# Patient Record
Sex: Female | Born: 1985 | Race: Black or African American | Hispanic: No | Marital: Single | Smoking: Never smoker
Health system: Southern US, Community
[De-identification: ages and names within clinical notes are randomized; demographics above are authoritative.]

## PROBLEM LIST (undated history)

## (undated) DIAGNOSIS — R51 Headache: Secondary | ICD-10-CM

## (undated) DIAGNOSIS — R519 Headache, unspecified: Secondary | ICD-10-CM

## (undated) DIAGNOSIS — D509 Iron deficiency anemia, unspecified: Secondary | ICD-10-CM

## (undated) DIAGNOSIS — N200 Calculus of kidney: Secondary | ICD-10-CM

## (undated) DIAGNOSIS — Z87442 Personal history of urinary calculi: Secondary | ICD-10-CM

## (undated) HISTORY — PX: CYSTECTOMY: SUR359

## (undated) HISTORY — PX: BREAST SURGERY: SHX581

---

## 2013-11-20 ENCOUNTER — Encounter (HOSPITAL_COMMUNITY): Payer: Self-pay | Admitting: Emergency Medicine

## 2013-11-20 ENCOUNTER — Emergency Department (INDEPENDENT_AMBULATORY_CARE_PROVIDER_SITE_OTHER)
Admission: EM | Admit: 2013-11-20 | Discharge: 2013-11-20 | Disposition: A | Discharge status: Home / Self Care | Payer: 59 | Source: Home / Self Care | Attending: Family Medicine | Admitting: Family Medicine

## 2013-11-20 DIAGNOSIS — M545 Low back pain, unspecified: Secondary | ICD-10-CM

## 2013-11-20 DIAGNOSIS — Z041 Encounter for examination and observation following transport accident: Secondary | ICD-10-CM

## 2013-11-20 MED ORDER — DICLOFENAC POTASSIUM 50 MG PO TABS: 50.0000 mg | ORAL_TABLET | Freq: Three times a day (TID) | ORAL | Status: DC

## 2013-11-20 MED ORDER — CYCLOBENZAPRINE HCL 5 MG PO TABS: 5.0000 mg | ORAL_TABLET | Freq: Three times a day (TID) | ORAL | Status: DC | PRN
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
## 2015-10-10 ENCOUNTER — Ambulatory Visit: Payer: Self-pay | Admitting: Gynecology

## 2015-10-31 ENCOUNTER — Encounter: Payer: Self-pay | Admitting: Gynecology

## 2015-10-31 ENCOUNTER — Ambulatory Visit (INDEPENDENT_AMBULATORY_CARE_PROVIDER_SITE_OTHER): Payer: 59 | Admitting: Gynecology

## 2015-10-31 VITALS — BP 110/78 | Ht 65.0 in | Wt 203.0 lb

## 2015-10-31 DIAGNOSIS — Z01419 Encounter for gynecological examination (general) (routine) without abnormal findings: Secondary | ICD-10-CM

## 2015-10-31 DIAGNOSIS — D251 Intramural leiomyoma of uterus: Secondary | ICD-10-CM

## 2015-10-31 NOTE — Patient Instructions (Signed)
Follow up for ultrasound as scheduled 

## 2015-10-31 NOTE — Progress Notes (Signed)
    Kristina Leon Jun 25, 1986 [id number]        30 y.o.  G0P0000 new patient for annual exam.  Recently was evaluated in January for annual exam and was found to have a palpable lower abdominal mass. She subsequently had an ultrasound which showed multiple leiomyoma with a large fundal 12 cm myoma. Patient notes her menses are regular monthly and moderate to heavy. No intermenstrual bleeding.  Patient is virginal and never had a Pap smear.  Past medical history,surgical history, problem list, medications, allergies, family history and social history were all reviewed and documented as reviewed in the EPIC chart.  ROS:  Performed with pertinent positives and negatives included in the history, assessment and plan.   Additional significant findings :  none   Exam: Leanne Lovely Vitals:   10/31/15 1201  BP: 110/78  Height: 5\' 5"  (1.651 m)  Weight: 203 lb (92.08 kg)   General appearance:  Normal affect, orientation and appearance. Skin: Grossly normal HEENT: Without gross lesions.  No cervical or supraclavicular adenopathy. Thyroid normal.  Lungs:  Clear without wheezing, rales or rhonchi Cardiac: RR, without RMG Abdominal:  Soft, nontender with palpable firm abdominopelvic mass to the umbilicus consistent with her history of leiomyoma. Breasts:  Examined lying and sitting without masses, retractions, discharge or axillary adenopathy.  Small scar in the right tail of Spence Pelvic:  Ext/BUS/vagina virginal with limited speculum exposure.  Cervix with limited speculum exposure. Pap smear done  Uterus grossly enlarged to the level of the umbilicus.   Adnexa without masses or tenderness    Anus and perineum normal   Rectovaginal normal sphincter tone without palpated masses or tenderness.    Assessment/Plan:  30 y.o. G0P0000 female for annual exam with regular menses, virginal status.   1. Leiomyoma. Patient with ultrasound report showing large fundal 12 cm myoma and multiple  other intramural myomas. She does note some abdominal discomfort and pressure symptoms. Menses moderate and regular. Virginal status. Reviewed situation with the patient. I discussed the issues of leiomyoma and options for management include continued expectant management, medications for menstrual suppression, surgery such as myomectomy up to including hysterectomy. Approaches to include open versus robotic also discussed. The pros/cons, risks/benefits of each choice discussed. Will start with follow up ultrasound now to better define the myomas and rule out adnexal pathology. Patient will think of her options and and follow up with me at that time. If we proceed with myomectomy she understands that I would perform an open myomectomy. Risks include transfusion as well as unexpected complications necessitating hysterectomy was discussed. Referral for robotic assessment discussed and offered. Will rediscuss after ultrasound. 2. First Pap smear done today. 3. Breast health. Patient does have a history of right breast mass excision the patient thinks was fibroma. Normal exam today. Patient will continue with self breast exams monthly. 4. Health maintenance. No routine lab work done as this was done at her primary exam in January   Anastasio Auerbach MD, 12:57 PM 10/31/2015

## 2015-11-10 ENCOUNTER — Other Ambulatory Visit: Payer: Self-pay | Admitting: Gynecology

## 2015-11-10 ENCOUNTER — Encounter: Payer: Self-pay | Admitting: Gynecology

## 2015-11-10 ENCOUNTER — Ambulatory Visit (INDEPENDENT_AMBULATORY_CARE_PROVIDER_SITE_OTHER): Payer: 59 | Admitting: Gynecology

## 2015-11-10 ENCOUNTER — Ambulatory Visit (INDEPENDENT_AMBULATORY_CARE_PROVIDER_SITE_OTHER): Payer: 59

## 2015-11-10 VITALS — BP 112/74

## 2015-11-10 DIAGNOSIS — R9389 Abnormal findings on diagnostic imaging of other specified body structures: Secondary | ICD-10-CM

## 2015-11-10 DIAGNOSIS — D251 Intramural leiomyoma of uterus: Secondary | ICD-10-CM

## 2015-11-10 DIAGNOSIS — N852 Hypertrophy of uterus: Secondary | ICD-10-CM

## 2015-11-10 DIAGNOSIS — N831 Corpus luteum cyst of ovary, unspecified side: Secondary | ICD-10-CM

## 2015-11-10 DIAGNOSIS — N920 Excessive and frequent menstruation with regular cycle: Secondary | ICD-10-CM | POA: Diagnosis not present

## 2015-11-10 DIAGNOSIS — R938 Abnormal findings on diagnostic imaging of other specified body structures: Secondary | ICD-10-CM | POA: Diagnosis not present

## 2015-11-10 LAB — CBC WITH DIFFERENTIAL/PLATELET
BASOS PCT: 1 %
Basophils Absolute: 67 cells/uL (ref 0–200)
Eosinophils Absolute: 268 cells/uL (ref 15–500)
Eosinophils Relative: 4 %
HCT: 37.5 % (ref 35.0–45.0)
Hemoglobin: 12 g/dL (ref 11.7–15.5)
Lymphocytes Relative: 28 %
Lymphs Abs: 1876 cells/uL (ref 850–3900)
MCH: 24.8 pg — ABNORMAL LOW (ref 27.0–33.0)
MCHC: 32 g/dL (ref 32.0–36.0)
MCV: 77.5 fL — ABNORMAL LOW (ref 80.0–100.0)
MONOS PCT: 8 %
MPV: 8.6 fL (ref 7.5–12.5)
Monocytes Absolute: 536 cells/uL (ref 200–950)
Neutro Abs: 3953 cells/uL (ref 1500–7800)
Neutrophils Relative %: 59 %
PLATELETS: 393 10*3/uL (ref 140–400)
RBC: 4.84 MIL/uL (ref 3.80–5.10)
RDW: 16.1 % — AB (ref 11.0–15.0)
WBC: 6.7 10*3/uL (ref 3.8–10.8)

## 2015-11-10 NOTE — Patient Instructions (Signed)
Office will call you with the blood test results and to schedule surgery.

## 2015-11-10 NOTE — Progress Notes (Signed)
    Chloie Raso [birth year] [id number]        30 y.o.  G0P0000 presents for ultrasound.  Was evaluated for annual exam in January by her primary physician and found to have a palpable abdominal mass. Ultrasound showed multiple leiomyoma with a large fundal 12 cm myoma. Menses are regular although moderate to heavy. Exam by myself showed a large abdominopelvic mass to the umbilicus.  Pelvic exam was limited by her virginal status.  Past medical history,surgical history, problem list, medications, allergies, family history and social history were all reviewed and documented in the EPIC chart.  Directed ROS with pertinent positives and negatives documented in the history of present illness/assessment and plan.  Exam: Filed Vitals:   11/10/15 1130  BP: 112/74   General appearance:  Normal Abdomen soft nontender with abdominopelvic mass to the umbilicus. No guarding rebound organomegaly  Ultrasound confirms enlarged uterus with multiple myomas measuring 22 mm, 30 mm, 36 mm, 43 mm, 58 mm, large evacuated fundal myoma 14 centimeters. Endometrial echo 13 mm noting LMP 10/22/2015. Right left ovaries with physiologic changes. Cul-de-sac negative.  Assessment/Plan:  30 y.o. G0P0000 with large leiomyoma and overall enlarged uterus to the umbilicus. Patient is having pelvic abdominal discomfort. Menses are regular moderate to heavy. Will check baseline CBC today. Reviewed situation and options with the patient her mother and other friend. Reviewed pathophysiology of leiomyoma. Options for expectant management with follow up ultrasound to monitor, surgical intervention to include multiple myomectomy open versus attempted robotic with referral possibility. Preoperative Depo-Lupron suppression for hemoglobin recovery also discussed. Which involved with a myomectomy reviewed in this virginal patient who desires future childbearing. Realistic risks to include transfusion, multiple incisions on the uterus leading to  scarring with possible rupture during pregnancy and need for cesarean section for delivery, scarring leading to tubal obstruction or cavity distortion and infertility and lastly uncontrollable bleeding necessitating hysterectomy.  Patient does not have aversions to transfusion as needed. She wants to proceed with surgery. Will check baseline CBC now to determine whether Depo-Lupron for hemoglobin recovery needed. She is encouraged to continue her daily iron. Will schedule surgery at her convenience.    Anastasio Auerbach MD, 12:05 PM 11/10/2015

## 2015-11-13 ENCOUNTER — Telehealth: Payer: Self-pay

## 2015-11-13 NOTE — Telephone Encounter (Signed)
I called patient to schedule Abd Myomectomy. We discussed her ins benefits and her estimated surgery prepayment amount due to [facility] before surgery. I will also be sending her a Ambulance person.  She would like to schedule for May 9th at 7:30 am and we scheduled her a pre op appt with Dr. Loetta Rough for 12/04/15 9:00am.

## 2015-11-17 ENCOUNTER — Telehealth: Payer: Self-pay

## 2015-11-17 NOTE — Telephone Encounter (Signed)
Patient called me back. She does need to r/s her surgery to 6/6. I moved her to 7:30am on 01/06/16.  Pre op appt was changed as well.

## 2015-11-17 NOTE — Telephone Encounter (Signed)
Patient called stating she needed to reschedule her surgery set for 12/09/15 as she has learned that she has an "important exam" scheduled for the next day.  She asked to reschedule later. I called her back and left message that next available is June 6th. I asked her to call me back and let me know it she would like me to reschedule her to that date.

## 2015-12-02 ENCOUNTER — Telehealth: Payer: Self-pay

## 2015-12-02 NOTE — Telephone Encounter (Signed)
Patient called in voice mail to inform me that her company will be calling me because they are needing information related to her being out for surgery.  I called her and per DPR access note I left detailed message that I cannot release info about her or from her med record to anyone without her written permission. I told her how to print release form from our web site or she can stop by to fill it out.

## 2015-12-04 ENCOUNTER — Ambulatory Visit: Payer: 59 | Admitting: Gynecology

## 2015-12-25 NOTE — Patient Instructions (Signed)
Your procedure is scheduled on:  Tuesday, January 06, 2016  Enter through the Main Entrance of [facility] at:  6:00 AM  Pick up the phone at the desk and dial [phone].  Call this number if you have problems the morning of surgery: [phone].  Remember:  Do NOT eat food or drink after:  Monday, January 05, 2016  Take these medicines the morning of surgery with a SIP OF WATER:  None  Do NOT wear jewelry (body piercing), metal hair clips/bobby pins, make-up, or nail polish. Do NOT wear lotions, powders, or perfumes.  You may wear deodorant. Do NOT shave for 48 hours prior to surgery. Do NOT bring valuables to the hospital. Contacts, dentures, or bridgework may not be worn into surgery.  Leave suitcase in car.  After surgery it may be brought to your room.  For patients admitted to the hospital, checkout time is 11:00 AM the day of discharge.

## 2015-12-26 ENCOUNTER — Encounter (HOSPITAL_COMMUNITY): Payer: Self-pay

## 2015-12-26 ENCOUNTER — Encounter (HOSPITAL_COMMUNITY)
Admission: RE | Admit: 2015-12-26 | Discharge: 2015-12-26 | Disposition: A | Discharge status: Home / Self Care | Payer: 59 | Source: Ambulatory Visit | Attending: Gynecology | Admitting: Gynecology

## 2015-12-26 DIAGNOSIS — R102 Pelvic and perineal pain: Secondary | ICD-10-CM | POA: Diagnosis not present

## 2015-12-26 DIAGNOSIS — Z01812 Encounter for preprocedural laboratory examination: Secondary | ICD-10-CM | POA: Diagnosis present

## 2015-12-26 DIAGNOSIS — D259 Leiomyoma of uterus, unspecified: Secondary | ICD-10-CM | POA: Diagnosis not present

## 2015-12-26 HISTORY — DX: Calculus of kidney: N20.0

## 2015-12-26 HISTORY — DX: Headache: R51

## 2015-12-26 HISTORY — DX: Headache, unspecified: R51.9

## 2015-12-26 LAB — CBC
HCT: 38.1 % (ref 36.0–46.0)
HEMOGLOBIN: 12.6 g/dL (ref 12.0–15.0)
MCH: 26.1 pg (ref 26.0–34.0)
MCHC: 33.1 g/dL (ref 30.0–36.0)
MCV: 79 fL (ref 78.0–100.0)
Platelets: 341 10*3/uL (ref 150–400)
RBC: 4.82 MIL/uL (ref 3.87–5.11)
RDW: 15.7 % — ABNORMAL HIGH (ref 11.5–15.5)
WBC: 6.1 10*3/uL (ref 4.0–10.5)

## 2015-12-26 LAB — TYPE AND SCREEN
ABO/RH(D): A POS
Antibody Screen: NEGATIVE

## 2015-12-26 LAB — ABO/RH: ABO/RH(D): A POS

## 2015-12-30 ENCOUNTER — Encounter: Payer: Self-pay | Admitting: Gynecology

## 2016-01-01 ENCOUNTER — Encounter: Payer: Self-pay | Admitting: Gynecology

## 2016-01-01 ENCOUNTER — Ambulatory Visit (INDEPENDENT_AMBULATORY_CARE_PROVIDER_SITE_OTHER): Payer: 59 | Admitting: Gynecology

## 2016-01-01 VITALS — BP 116/74

## 2016-01-01 DIAGNOSIS — D251 Intramural leiomyoma of uterus: Secondary | ICD-10-CM

## 2016-01-01 DIAGNOSIS — R102 Pelvic and perineal pain: Secondary | ICD-10-CM

## 2016-01-01 NOTE — Patient Instructions (Signed)
Followup for surgery as scheduled. 

## 2016-01-01 NOTE — Progress Notes (Signed)
Kristina Leon 08-19-1985 [id number]   Preoperative consult  Chief complaint: leiomyoma, pelvic pain  History of present illness: 30 y.o. G0P0000 who presented to her primary physician for annual exam and was found to have a palpable abdominal mass January 2017. Follow up ultrasound confirmed large multiple myomas including 58 mm, 43 mm, 36 mm, 30 mm, 22 mm and a 14 cm pedunculated fundal myoma.. Since then patient has noticed increasing pelvic discomfort daily with low back pain. She is finding it difficult to sleep with a pulling sensation in her pelvis. Options for management were reviewed with the patient to include observation, Depo-Lupron treatment, multiple myomectomy either open or possible attempted robotic.  Patient elects for open myomectomy.  Past medical history,surgical history, medications, allergies, family history and social history were all reviewed and documented in the EPIC chart.  ROS:  Was performed and pertinent positives and negatives are included in the history of present illness.  Exam: Filed Vitals:   01/01/16 0954  BP: 116/74   General: well developed, well nourished female, no acute distress HEENT: normal  Lungs: clear to auscultation without wheezing, rales or rhonchi  Cardiac: regular rate without rubs, murmurs or gallops  Abdomen: soft, with abdominopelvic mass to the umbilicus firm, midline. No guarding, rebound' organomegaly Pelvic: deferred due to limited value with her virginal status  Assessment/Plan:  30 y.o. G0P0000 with multiple myomas and enlarged uterus now with pelvic pain and back pain. Options for management reviewed and she elects for open multiple myomectomy. I reviewed in particular with a myomectomy the the issue that I may not remove all of her myomas but I can only address those that I can see or feel. She is at clear increased risk for developing future myomas requiring future surgeries and she understands that this is not a guarantee that she  will not have this issue in the future. She also understands the realistic risk of transfusion associated with multiple myomectomies and the risks of transfusion to include transfusion reaction hepatitis HIV mad cow disease and other unknown entities was discussed. We will be making multiple incisions on her uterus and she understands the risks that this may pose to future pregnancies with uterine rupture either before labor or during labor and that more than likely I will be recommending a C-section for any pregnancy based on the surgery but we will discuss that postoperatively. She also understands that scarring from the surgery may lead to tubal obstruction or cavity distortion that may lead to difficulties achieving pregnancy.  Her uterus is enlarged to the umbilicus and I discussed the possible surgical incisions to include a midline versus Pfannenstiel. I'm going to reexamine her under anesthesia and make an intraoperative decision as to what I feel this the best incision to address her myomas and she understands and agrees with this. I will attempt a Pfannenstiel but if I feel we do not have enough room then I will do a midline incision and she understands from a cosmetic standpoint that this will lead to scarring in the midline to the level of the umbilicus and understands and accepts this.  Lastly she understands that if catastrophic bleeding occurs that I may need to proceed either with removing adnexa up to including hysterectomy and she understands and accepts the possibility.  The general aspects of surgery were also reviewed with her and the expected intraoperative and postoperative courses as well as the recovery period were reviewed. The risks of infection, prolonged antibiotics, reoperation for abscess or  hematoma formation was discussed.  Incisional complications to include opening and draining of incisions and closure by secondary intention which may take weeks, dehiscence and long-term issues of  keloid/cosmetics and hernia formation were reviewed. The risk of inadvertent injury to internal organs including bowel, bladder, ureters, vessels, nerves either immediately recognized or delay recognized necessitating major exploratory reparative surgeries and future reparative surgeries including bowel resection, ostomy formation, bladder repair, ureteral damage repair was discussed with her. The patient's questions were answered to her satisfaction and she is ready to proceed with surgery.    Anastasio Auerbach MD, 10:13 AM 01/01/2016

## 2016-01-01 NOTE — H&P (Signed)
Kristina Leon 1986/06/24 [id number]   History and Physical  Chief complaint: leiomyoma, pelvic pain  History of present illness: 30 y.o. G0P0000 who presented to her primary physician for annual exam and was found to have a palpable abdominal mass January 2017. Follow up ultrasound confirmed large multiple myomas including 58 mm, 43 mm, 36 mm, 30 mm, 22 mm and a 14 cm pedunculated fundal myoma.. Since then patient has noticed increasing pelvic discomfort daily with low back pain. She is finding it difficult to sleep with a pulling sensation in her pelvis. Options for management were reviewed with the patient to include observation, Depo-Lupron treatment, multiple myomectomy either open or possible attempted robotic.  Patient elects for open myomectomy.  Past medical history,surgical history, medications, allergies, family history and social history were all reviewed and documented in the EPIC chart.  ROS:  Was performed and pertinent positives and negatives are included in the history of present illness.  Exam: Filed Vitals:   01/01/16 0954  BP: 116/74   General: well developed, well nourished female, no acute distress HEENT: normal  Lungs: clear to auscultation without wheezing, rales or rhonchi  Cardiac: regular rate without rubs, murmurs or gallops  Abdomen: soft, with abdominopelvic mass to the umbilicus firm, midline. No guarding, rebound' organomegaly Pelvic: deferred due to limited value with her virginal status  Assessment/Plan:  30 y.o. G0P0000 with multiple myomas and enlarged uterus now with pelvic pain and back pain. Options for management reviewed and she elects for open multiple myomectomy. I reviewed in particular with a myomectomy the the issue that I may not remove all of her myomas but I can only address those that I can see or feel. She is at clear increased risk for developing future myomas requiring future surgeries and she understands that this is not a guarantee that  she will not have this issue in the future. She also understands the realistic risk of transfusion associated with multiple myomectomies and the risks of transfusion to include transfusion reaction hepatitis HIV mad cow disease and other unknown entities was discussed. We will be making multiple incisions on her uterus and she understands the risks that this may pose to future pregnancies with uterine rupture either before labor or during labor and that more than likely I will be recommending a C-section for any pregnancy based on the surgery but we will discuss that postoperatively. She also understands that scarring from the surgery may lead to tubal obstruction or cavity distortion that may lead to difficulties achieving pregnancy.  Her uterus is enlarged to the umbilicus and I discussed the possible surgical incisions to include a midline versus Pfannenstiel. I'm going to reexamine her under anesthesia and make an intraoperative decision as to what I feel this the best incision to address her myomas and she understands and agrees with this. I will attempt a Pfannenstiel but if I feel we do not have enough room then I will do a midline incision and she understands from a cosmetic standpoint that this will lead to scarring in the midline to the level of the umbilicus and understands and accepts this.  Lastly she understands that if catastrophic bleeding occurs that I may need to proceed either with removing adnexa up to including hysterectomy and she understands and accepts the possibility.  The general aspects of surgery were also reviewed with her and the expected intraoperative and postoperative courses as well as the recovery period were reviewed. The risks of infection, prolonged antibiotics, reoperation for abscess  or hematoma formation was discussed.  Incisional complications to include opening and draining of incisions and closure by secondary intention which may take weeks, dehiscence and long-term issues  of keloid/cosmetics and hernia formation were reviewed. The risk of inadvertent injury to internal organs including bowel, bladder, ureters, vessels, nerves either immediately recognized or delay recognized necessitating major exploratory reparative surgeries and future reparative surgeries including bowel resection, ostomy formation, bladder repair, ureteral damage repair was discussed with her. The patient's questions were answered to her satisfaction and she is ready to proceed with surgery.   Anastasio Auerbach MD, 10:30 AM 01/01/2016

## 2016-01-05 MED ORDER — DEXTROSE 5 % IV SOLN
2.0000 g | INTRAVENOUS | Status: AC
  Administered 2016-01-06: 2 g via INTRAVENOUS
  Filled 2016-01-05: qty 2

## 2016-01-06 ENCOUNTER — Inpatient Hospital Stay (HOSPITAL_COMMUNITY): Payer: 59 | Admitting: Anesthesiology

## 2016-01-06 ENCOUNTER — Encounter (HOSPITAL_COMMUNITY): Payer: Self-pay | Admitting: Emergency Medicine

## 2016-01-06 ENCOUNTER — Encounter (HOSPITAL_COMMUNITY)
Admission: AD | Disposition: A | Discharge status: Home / Self Care | Payer: Self-pay | Source: Ambulatory Visit | Attending: Gynecology

## 2016-01-06 ENCOUNTER — Ambulatory Visit (HOSPITAL_COMMUNITY)
Admission: AD | Admit: 2016-01-06 | Discharge: 2016-01-07 | Disposition: A | Discharge status: Home / Self Care | Payer: 59 | Source: Ambulatory Visit | Attending: Gynecology | Admitting: Gynecology

## 2016-01-06 DIAGNOSIS — D259 Leiomyoma of uterus, unspecified: Secondary | ICD-10-CM | POA: Diagnosis not present

## 2016-01-06 DIAGNOSIS — R109 Unspecified abdominal pain: Secondary | ICD-10-CM | POA: Diagnosis not present

## 2016-01-06 DIAGNOSIS — R102 Pelvic and perineal pain: Secondary | ICD-10-CM | POA: Diagnosis not present

## 2016-01-06 DIAGNOSIS — D219 Benign neoplasm of connective and other soft tissue, unspecified: Secondary | ICD-10-CM

## 2016-01-06 HISTORY — PX: MYOMECTOMY: SHX85

## 2016-01-06 LAB — PREGNANCY, URINE: PREG TEST UR: NEGATIVE

## 2016-01-06 SURGERY — MYOMECTOMY, ABDOMINAL APPROACH
Anesthesia: General | Site: Abdomen

## 2016-01-06 MED ORDER — FENTANYL CITRATE (PF) 250 MCG/5ML IJ SOLN
INTRAMUSCULAR | Status: AC
  Filled 2016-01-06: qty 5

## 2016-01-06 MED ORDER — MEPERIDINE HCL 25 MG/ML IJ SOLN: 6.2500 mg | INTRAMUSCULAR | Status: DC | PRN

## 2016-01-06 MED ORDER — DIPHENHYDRAMINE HCL 12.5 MG/5ML PO ELIX: 12.5000 mg | ORAL_SOLUTION | Freq: Four times a day (QID) | ORAL | Status: DC | PRN

## 2016-01-06 MED ORDER — DIPHENHYDRAMINE HCL 50 MG/ML IJ SOLN: 12.5000 mg | Freq: Four times a day (QID) | INTRAMUSCULAR | Status: DC | PRN

## 2016-01-06 MED ORDER — GLYCOPYRROLATE 0.2 MG/ML IJ SOLN
INTRAMUSCULAR | Status: DC | PRN
  Administered 2016-01-06: 0.6 mg via INTRAVENOUS

## 2016-01-06 MED ORDER — ROCURONIUM BROMIDE 100 MG/10ML IV SOLN
INTRAVENOUS | Status: AC
  Filled 2016-01-06: qty 1

## 2016-01-06 MED ORDER — BUPIVACAINE LIPOSOME 1.3 % IJ SUSP
20.0000 mL | Freq: Once | INTRAMUSCULAR | Status: DC
  Filled 2016-01-06: qty 20

## 2016-01-06 MED ORDER — DEXAMETHASONE SODIUM PHOSPHATE 10 MG/ML IJ SOLN
INTRAMUSCULAR | Status: DC | PRN
Start: 2016-01-06 — End: 2016-01-06
  Administered 2016-01-06: 5 mg via INTRAVENOUS

## 2016-01-06 MED ORDER — ROCURONIUM BROMIDE 100 MG/10ML IV SOLN
INTRAVENOUS | Status: DC | PRN
  Administered 2016-01-06 (×2): 10 mg via INTRAVENOUS
  Administered 2016-01-06: 30 mg via INTRAVENOUS
  Administered 2016-01-06: 10 mg via INTRAVENOUS

## 2016-01-06 MED ORDER — SODIUM CHLORIDE 0.9 % IJ SOLN
INTRAMUSCULAR | Status: AC
  Filled 2016-01-06: qty 50

## 2016-01-06 MED ORDER — ONDANSETRON HCL 4 MG/2ML IJ SOLN
INTRAMUSCULAR | Status: AC
  Filled 2016-01-06: qty 2

## 2016-01-06 MED ORDER — LACTATED RINGERS IV SOLN
INTRAVENOUS | Status: DC
  Administered 2016-01-06 (×3): via INTRAVENOUS

## 2016-01-06 MED ORDER — KETOROLAC TROMETHAMINE 30 MG/ML IJ SOLN
INTRAMUSCULAR | Status: AC
  Filled 2016-01-06: qty 1

## 2016-01-06 MED ORDER — NALOXONE HCL 0.4 MG/ML IJ SOLN: 0.4000 mg | INTRAMUSCULAR | Status: DC | PRN

## 2016-01-06 MED ORDER — HYDROMORPHONE HCL 1 MG/ML IJ SOLN
INTRAMUSCULAR | Status: AC
  Filled 2016-01-06: qty 1

## 2016-01-06 MED ORDER — VASOPRESSIN 20 UNIT/ML IV SOLN
INTRAVENOUS | Status: AC
  Filled 2016-01-06: qty 1

## 2016-01-06 MED ORDER — SODIUM CHLORIDE 0.9% FLUSH: 9.0000 mL | INTRAVENOUS | Status: DC | PRN

## 2016-01-06 MED ORDER — SCOPOLAMINE 1 MG/3DAYS TD PT72
1.0000 | MEDICATED_PATCH | Freq: Once | TRANSDERMAL | Status: DC
  Administered 2016-01-06: 1.5 mg via TRANSDERMAL

## 2016-01-06 MED ORDER — SCOPOLAMINE 1 MG/3DAYS TD PT72
MEDICATED_PATCH | TRANSDERMAL | Status: AC
  Administered 2016-01-06: 1.5 mg via TRANSDERMAL
  Filled 2016-01-06: qty 1

## 2016-01-06 MED ORDER — BUPIVACAINE LIPOSOME 1.3 % IJ SUSP
INTRAMUSCULAR | Status: DC | PRN
  Administered 2016-01-06: 40 mL

## 2016-01-06 MED ORDER — ONDANSETRON HCL 4 MG/2ML IJ SOLN
4.0000 mg | Freq: Once | INTRAMUSCULAR | Status: AC | PRN
  Administered 2016-01-06: 4 mg via INTRAVENOUS

## 2016-01-06 MED ORDER — PROPOFOL 10 MG/ML IV BOLUS
INTRAVENOUS | Status: AC
  Filled 2016-01-06: qty 20

## 2016-01-06 MED ORDER — ONDANSETRON HCL 4 MG/2ML IJ SOLN
4.0000 mg | Freq: Four times a day (QID) | INTRAMUSCULAR | Status: DC | PRN
  Administered 2016-01-06: 4 mg via INTRAVENOUS
  Filled 2016-01-06: qty 2

## 2016-01-06 MED ORDER — SODIUM CHLORIDE 0.9 % IV SOLN: 20.0000 mL | Freq: Once | INTRAVENOUS | Status: DC

## 2016-01-06 MED ORDER — FENTANYL CITRATE (PF) 100 MCG/2ML IJ SOLN
100.0000 ug | INTRAMUSCULAR | Status: AC
  Administered 2016-01-06: 100 ug via INTRAVENOUS

## 2016-01-06 MED ORDER — DEXAMETHASONE SODIUM PHOSPHATE 4 MG/ML IJ SOLN
INTRAMUSCULAR | Status: AC
  Filled 2016-01-06: qty 1

## 2016-01-06 MED ORDER — NEOSTIGMINE METHYLSULFATE 10 MG/10ML IV SOLN
INTRAVENOUS | Status: DC | PRN
  Administered 2016-01-06: 3 mg via INTRAVENOUS

## 2016-01-06 MED ORDER — FENTANYL CITRATE (PF) 100 MCG/2ML IJ SOLN
INTRAMUSCULAR | Status: DC | PRN
  Administered 2016-01-06: 100 ug via INTRAVENOUS
  Administered 2016-01-06 (×3): 50 ug via INTRAVENOUS
  Administered 2016-01-06 (×2): 100 ug via INTRAVENOUS

## 2016-01-06 MED ORDER — MIDAZOLAM HCL 2 MG/2ML IJ SOLN
INTRAMUSCULAR | Status: AC
  Filled 2016-01-06: qty 2

## 2016-01-06 MED ORDER — LIDOCAINE HCL (CARDIAC) 20 MG/ML IV SOLN
INTRAVENOUS | Status: AC
  Filled 2016-01-06: qty 5

## 2016-01-06 MED ORDER — DEXTROSE-NACL 5-0.9 % IV SOLN
INTRAVENOUS | Status: DC
  Administered 2016-01-06 – 2016-01-07 (×3): via INTRAVENOUS

## 2016-01-06 MED ORDER — ONDANSETRON HCL 4 MG/2ML IJ SOLN
INTRAMUSCULAR | Status: DC | PRN
  Administered 2016-01-06: 4 mg via INTRAVENOUS

## 2016-01-06 MED ORDER — SODIUM CHLORIDE 0.9 % IV SOLN
INTRAVENOUS | Status: DC | PRN
  Administered 2016-01-06: 50 mL via INTRAMUSCULAR

## 2016-01-06 MED ORDER — KETOROLAC TROMETHAMINE 30 MG/ML IJ SOLN: 30.0000 mg | Freq: Once | INTRAMUSCULAR | Status: DC

## 2016-01-06 MED ORDER — KETOROLAC TROMETHAMINE 30 MG/ML IJ SOLN
INTRAMUSCULAR | Status: DC | PRN
  Administered 2016-01-06: 30 mg via INTRAVENOUS

## 2016-01-06 MED ORDER — SODIUM CHLORIDE 0.9 % IJ SOLN
INTRAMUSCULAR | Status: AC
  Filled 2016-01-06: qty 100

## 2016-01-06 MED ORDER — FENTANYL CITRATE (PF) 100 MCG/2ML IJ SOLN
INTRAMUSCULAR | Status: AC
  Filled 2016-01-06: qty 2

## 2016-01-06 MED ORDER — MIDAZOLAM HCL 2 MG/2ML IJ SOLN
INTRAMUSCULAR | Status: DC | PRN
  Administered 2016-01-06: 2 mg via INTRAVENOUS

## 2016-01-06 MED ORDER — PROPOFOL 10 MG/ML IV BOLUS
INTRAVENOUS | Status: DC | PRN
  Administered 2016-01-06: 180 mg via INTRAVENOUS

## 2016-01-06 MED ORDER — LIDOCAINE HCL (CARDIAC) 20 MG/ML IV SOLN
INTRAVENOUS | Status: DC | PRN
  Administered 2016-01-06: 80 mg via INTRAVENOUS

## 2016-01-06 MED ORDER — HYDROMORPHONE HCL 1 MG/ML IJ SOLN
0.2500 mg | INTRAMUSCULAR | Status: DC | PRN
  Administered 2016-01-06 (×4): 0.5 mg via INTRAVENOUS

## 2016-01-06 MED ORDER — MORPHINE SULFATE 2 MG/ML IV SOLN
INTRAVENOUS | Status: DC
  Administered 2016-01-06: 4.5 mL via INTRAVENOUS
  Administered 2016-01-06: 12:00:00 via INTRAVENOUS
  Administered 2016-01-06: 4.5 mL via INTRAVENOUS
  Administered 2016-01-06: 16.5 mg via INTRAVENOUS
  Administered 2016-01-07 (×2): 1.5 mg via INTRAVENOUS
  Filled 2016-01-06: qty 25

## 2016-01-06 SURGICAL SUPPLY — 49 items
BARRIER ADHS 3X4 INTERCEED (GAUZE/BANDAGES/DRESSINGS) IMPLANT
BENZOIN TINCTURE PRP APPL 2/3 (GAUZE/BANDAGES/DRESSINGS) ×3 IMPLANT
CANISTER SUCT 3000ML (MISCELLANEOUS) ×3 IMPLANT
CATH FOLEY 2WAY  3CC  8FR (CATHETERS)
CATH FOLEY 2WAY 3CC 8FR (CATHETERS) IMPLANT
CELLS DAT CNTRL 66122 CELL SVR (MISCELLANEOUS) IMPLANT
CLOSURE WOUND 1/2 X4 (GAUZE/BANDAGES/DRESSINGS) ×1
CLOTH BEACON ORANGE TIMEOUT ST (SAFETY) ×3 IMPLANT
CONT PATH 16OZ SNAP LID 3702 (MISCELLANEOUS) ×3 IMPLANT
DECANTER SPIKE VIAL GLASS SM (MISCELLANEOUS) ×9 IMPLANT
DRAPE CESAREAN BIRTH W POUCH (DRAPES) ×3 IMPLANT
DRSG OPSITE POSTOP 3X4 (GAUZE/BANDAGES/DRESSINGS) ×3 IMPLANT
ELECT NEEDLE TIP 2.8 STRL (NEEDLE) ×3 IMPLANT
FILTER STRAW FLUID ASPIR (MISCELLANEOUS) IMPLANT
GAUZE SPONGE 4X4 16PLY XRAY LF (GAUZE/BANDAGES/DRESSINGS) ×6 IMPLANT
GLOVE BIO SURGEON STRL SZ7.5 (GLOVE) ×3 IMPLANT
GLOVE BIOGEL PI IND STRL 7.0 (GLOVE) ×1 IMPLANT
GLOVE BIOGEL PI INDICATOR 7.0 (GLOVE) ×2
GOWN STRL REUS W/TWL LRG LVL3 (GOWN DISPOSABLE) ×6 IMPLANT
IV STOPCOCK 4 WAY 40  W/Y SET (IV SOLUTION)
IV STOPCOCK 4 WAY 40 W/Y SET (IV SOLUTION) IMPLANT
NEEDLE HYPO 22GX1.5 SAFETY (NEEDLE) ×3 IMPLANT
NS IRRIG 1000ML POUR BTL (IV SOLUTION) ×3 IMPLANT
PACK ABDOMINAL GYN (CUSTOM PROCEDURE TRAY) ×3 IMPLANT
PAD OB MATERNITY 4.3X12.25 (PERSONAL CARE ITEMS) ×3 IMPLANT
PENCIL SMOKE EVAC W/HOLSTER (ELECTROSURGICAL) ×3 IMPLANT
RETAINER VISCERAL (MISCELLANEOUS) ×3 IMPLANT
RETRACTOR WND ALEXIS 25 LRG (MISCELLANEOUS) IMPLANT
RTRCTR WOUND ALEXIS 18CM MED (MISCELLANEOUS)
RTRCTR WOUND ALEXIS 25CM LRG (MISCELLANEOUS)
SPONGE GAUZE 4X4 12PLY STER LF (GAUZE/BANDAGES/DRESSINGS) ×6 IMPLANT
STAPLER VISISTAT 35W (STAPLE) ×3 IMPLANT
STRIP CLOSURE SKIN 1/2X4 (GAUZE/BANDAGES/DRESSINGS) ×2 IMPLANT
SUT PDS AB 0 CT1 27 (SUTURE) ×3 IMPLANT
SUT PDS AB 2-0 CT1 27 (SUTURE) IMPLANT
SUT PDS AB 3-0 SH 27 (SUTURE) ×15 IMPLANT
SUT VIC AB 0 CT1 18XCR BRD8 (SUTURE) ×2 IMPLANT
SUT VIC AB 0 CT1 27 (SUTURE) ×6
SUT VIC AB 0 CT1 27XBRD ANBCTR (SUTURE) ×3 IMPLANT
SUT VIC AB 0 CT1 8-18 (SUTURE) ×4
SUT VIC AB 4-0 KS 27 (SUTURE) ×3 IMPLANT
SUT VICRYL 0 TIES 12 18 (SUTURE) ×3 IMPLANT
SYR 3ML 22GX1 1/2 SAFETY (SYRINGE) IMPLANT
SYR 50ML LL SCALE MARK (SYRINGE) IMPLANT
SYR 5ML LL (SYRINGE) ×3 IMPLANT
SYR CONTROL 10ML LL (SYRINGE) ×3 IMPLANT
TOWEL OR 17X24 6PK STRL BLUE (TOWEL DISPOSABLE) ×6 IMPLANT
TRAY FOLEY CATH SILVER 14FR (SET/KITS/TRAYS/PACK) ×3 IMPLANT
WATER STERILE IRR 1000ML POUR (IV SOLUTION) ×3 IMPLANT

## 2016-01-06 NOTE — Anesthesia Preprocedure Evaluation (Signed)
Anesthesia Evaluation  Patient identified by MRN, date of birth, ID band Patient awake    Reviewed: Allergy & Precautions, H&P , NPO status , Patient's Chart, lab work & pertinent test results  Airway Mallampati: II  TM Distance: >3 FB Neck ROM: full    Dental no notable dental hx. (+) Teeth Intact   Pulmonary neg pulmonary ROS,    Pulmonary exam normal        Cardiovascular negative cardio ROS Normal cardiovascular exam     Neuro/Psych negative psych ROS   GI/Hepatic negative GI ROS, Neg liver ROS,   Endo/Other  negative endocrine ROS  Renal/GU      Musculoskeletal   Abdominal (+) + obese,   Peds  Hematology negative hematology ROS (+)   Anesthesia Other Findings   Reproductive/Obstetrics negative OB ROS                             Anesthesia Physical Anesthesia Plan  ASA: II  Anesthesia Plan: General   Post-op Pain Management:    Induction: Intravenous  Airway Management Planned: Oral ETT  Additional Equipment:   Intra-op Plan:   Post-operative Plan: Extubation in OR  Informed Consent: I have reviewed the patients History and Physical, chart, labs and discussed the procedure including the risks, benefits and alternatives for the proposed anesthesia with the patient or authorized representative who has indicated his/her understanding and acceptance.   Dental Advisory Given  Plan Discussed with: CRNA and Surgeon  Anesthesia Plan Comments:         Anesthesia Quick Evaluation

## 2016-01-06 NOTE — H&P (Signed)
  The patient was examined.  I reviewed the proposed surgery and consent form with the patient.  The dictated history and physical is current and accurate and all questions were answered. The patient is ready to proceed with surgery and has a realistic understanding and expectation for the outcome.   Anastasio Auerbach MD, 7:02 AM 01/06/2016

## 2016-01-06 NOTE — Op Note (Signed)
Kristina Leon 1985-10-13 [id number]   Post Operative Note   Date of surgery:  01/06/2016  Pre Op Dx:  Leiomyoma, pelvic pain  Post Op Dx:  Leiomyoma, pelvic pain  Procedure:  Exploratory laparotomy, multiple myomectomy  Surgeon:  Anastasio Auerbach  Assistant:  Uvaldo Rising  Anesthesia:  General  EBL:  0000000 cc  Complications:  None  Specimen:  Leiomyomata to pathology  Clinical weight 1087 grams  Findings: EUA:  Large abdominopelvic mass to the level of the umbilicus   Operative:  Uterus with multiple myomas to include large pedunculated fundal myoma.  At the end of the procedure no remaining myomas were noted.  Multiple uterine incisions were necessary. Endometrial cavity was not entered. Right and left fallopian tubes normal length caliber and fimbriated ends. Right and left ovaries grossly normal.  Upper abdominal exam palpably normal.  Procedure:  The patient was taken to the operating room, underwent general anesthesia, received an abdominal preparation with DuraPrep and a vaginal/perineal preparation with Betadine per nursing personnel.  An indwelling Foley catheter was placed.  Due to her virginal status it was impossible to place an intrauterine balloon catheter for tubal patency assessment. The timeout was performed by the surgical team. The patient was draped in the usual fashion.The abdomen was entered through a Pfannenstiel incision achieving adequate hemostasis at all levels.The uterus was delivered through the incision and a Balfour retractor and bladder blade were placed and the intestines were packed from the pelvis.  The large pedunculated fundal myoma's pedicle was injected using vasopressin and a 20 unit per 50 cc dilution and a pursestring suture using 0 Vicryl suture was placed around the base and the myoma was excised as the suture was tied to achieve adequate hemostasis. Subsequently multiple myomas were removed sequentially, using the vasopressin injection through  several fundal and posterior uterine incisions without difficulty. The endometrial cavity was not entered throughout the entire procedure.  All of the incisions were closed using 0 Vicryl suture in interrupted myometrial stitches to reapproximate the myometrium and the uterine serosa was reapproximated at all incisions using 3-0 PDS suture in a running stitch. At the end of the procedure adequate hemostasis was visualized at all incision sites. The abdomen was copiously irrigated and reinspection of the pelvic organs showed that we remained away from the tubal insertion sites and both fallopian tubes and ovaries looked healthy at the end of the procedure. The bowel packing was removed and the Balfour retractor and bladder blade were removed.  Exparel was injected into the subcutaneous and fascial planes.  The anterior fascia was reapproximated using 0 Vicryl suture in a running stitch starting at the angle meeting in the middle.  The subcutaneous tissues were irrigated and adequate hemostasis achieved with electrocautery.  The skin was reapproximated using 4-0 Vicryl in a running subcuticular stitch,Steri-Strips and benzoin applied as well as a sterile dressing. The patient received intraoperative Toradol and was taken to the recovery room in good condition having tolerated the procedure well.     Anastasio Auerbach MD, 9:47 AM 01/06/2016

## 2016-01-06 NOTE — Progress Notes (Signed)
Patient ID: Kristina Leon, female   DOB: 04-30-1986, 30 y.o.   MRN: [id number] Kristina Leon [birth year] [id number]   Day of Surgery s/p Procedure(s): MYOMECTOMY-abdominal  Subjective: Patient reports no complaints, pain severity reported mild, No. taking PO, foley catheter in place with clear yellow urine, No. voiding, No. ambulating, No. passing flatus  Objective: Vital signs in last 24 hours: Temp:  [97.9 F (36.6 C)-98.5 F (36.9 C)] 98 F (36.7 C) (06/06 1232) Pulse Rate:  [75-98] 85 (06/06 1232) Resp:  [13-18] 18 (06/06 1232) BP: (118-133)/(68-96) 124/71 mmHg (06/06 1232) SpO2:  [95 %-99 %] 97 % (06/06 1232)    EXAM General: awake, alert and no distress GI: soft, minimal tenderness, dressing dry   Assessment: s/p Procedure(s): MYOMECTOMY-abdominal: stable and progressing well  Plan: Continue routine post operative care.  Results of surgery reviewed with patient and her family.  Advance postop care.   LOS: 0 days    Anastasio Auerbach MD, 12:43 PM 01/06/2016

## 2016-01-06 NOTE — Anesthesia Postprocedure Evaluation (Signed)
Anesthesia Post Note  Patient: Kristina Leon  Procedure(s) Performed: Procedure(s) (LRB): MYOMECTOMY-abdominal (N/A)  Patient location during evaluation: PACU Anesthesia Type: General Level of consciousness: sedated Pain management: pain level controlled Vital Signs Assessment: post-procedure vital signs reviewed and stable Respiratory status: spontaneous breathing Cardiovascular status: stable     Last Vitals:  Filed Vitals:   01/06/16 0945 01/06/16 1000  BP: 133/87 131/75  Pulse: 83 85  Temp:    Resp: 17 17    Last Pain:  Filed Vitals:   01/06/16 1014  PainSc: 6    Pain Goal: Patients Stated Pain Goal: 3 (01/06/16 1000)               Barbaraann Avans JR,JOHN Mateo Flow

## 2016-01-06 NOTE — Addendum Note (Signed)
Addendum  created 01/06/16 1521 by Hewitt Blade, CRNA   Modules edited: Clinical Notes   Clinical Notes:  File: [id number]

## 2016-01-06 NOTE — Anesthesia Postprocedure Evaluation (Signed)
Anesthesia Post Note  Patient: Kristina Leon  Procedure(s) Performed: Procedure(s) (LRB): MYOMECTOMY-abdominal (N/A)  Patient location during evaluation: [facility] Anesthesia Type: General Level of consciousness: awake and alert and oriented Pain management: pain level controlled Vital Signs Assessment: post-procedure vital signs reviewed and stable Respiratory status: spontaneous breathing and nonlabored ventilation Cardiovascular status: stable Postop Assessment: adequate PO intake Anesthetic complications: no     Last Vitals:  Filed Vitals:   01/06/16 1340 01/06/16 1432  BP: 118/70 123/72  Pulse: 93 92  Temp: 36.9 C 36.9 C  Resp: 20 16    Last Pain:  Filed Vitals:   01/06/16 1506  PainSc: 3    Pain Goal: Patients Stated Pain Goal: 3 (01/06/16 1137)               [name]

## 2016-01-06 NOTE — Transfer of Care (Signed)
Immediate Anesthesia Transfer of Care Note  Patient: Kristina Leon  Procedure(s) Performed: Procedure(s): MYOMECTOMY-abdominal (N/A)  Patient Location: PACU  Anesthesia Type:General  Level of Consciousness: awake  Airway & Oxygen Therapy: Patient Spontanous Breathing  Post-op Assessment: Report given to PACU RN  Post vital signs: stable  Filed Vitals:   01/06/16 0615  BP: 127/96  Pulse: 81  Temp: 36.6 C  Resp: 17    Complications: No apparent anesthesia complications

## 2016-01-06 NOTE — Anesthesia Procedure Notes (Signed)
Procedure Name: Intubation Date/Time: 01/06/2016 7:26 AM Performed by: Casimer Lanius A Pre-anesthesia Checklist: Patient identified, Emergency Drugs available, Suction available and Patient being monitored Patient Re-evaluated:Patient Re-evaluated prior to inductionOxygen Delivery Method: Circle system utilized and Simple face mask Preoxygenation: Pre-oxygenation with 100% oxygen Intubation Type: IV induction and Inhalational induction Ventilation: Mask ventilation without difficulty Laryngoscope Size: Mac and 3 Grade View: Grade I Tube type: Oral Tube size: 7.0 mm Number of attempts: 1 Airway Equipment and Method: Stylet Placement Confirmation: ETT inserted through vocal cords under direct vision,  positive ETCO2 and breath sounds checked- equal and bilateral Secured at: 19 (right lip) cm Tube secured with: Tape Dental Injury: Teeth and Oropharynx as per pre-operative assessment  Comments: Prior to intubation upper and lower gums bleeding

## 2016-01-07 ENCOUNTER — Encounter (HOSPITAL_COMMUNITY): Payer: Self-pay | Admitting: Gynecology

## 2016-01-07 DIAGNOSIS — D259 Leiomyoma of uterus, unspecified: Secondary | ICD-10-CM | POA: Diagnosis not present

## 2016-01-07 LAB — CBC
HCT: 34.3 % — ABNORMAL LOW (ref 36.0–46.0)
Hemoglobin: 11.1 g/dL — ABNORMAL LOW (ref 12.0–15.0)
MCH: 25.4 pg — ABNORMAL LOW (ref 26.0–34.0)
MCHC: 32.4 g/dL (ref 30.0–36.0)
MCV: 78.5 fL (ref 78.0–100.0)
Platelets: 325 10*3/uL (ref 150–400)
RBC: 4.37 MIL/uL (ref 3.87–5.11)
RDW: 15.6 % — ABNORMAL HIGH (ref 11.5–15.5)
WBC: 10.8 10*3/uL — AB (ref 4.0–10.5)

## 2016-01-07 MED ORDER — OXYCODONE-ACETAMINOPHEN 5-325 MG PO TABS: 2.0000 | ORAL_TABLET | Freq: Four times a day (QID) | ORAL | Status: DC | PRN

## 2016-01-07 MED ORDER — ONDANSETRON HCL 4 MG PO TABS: 4.0000 mg | ORAL_TABLET | Freq: Three times a day (TID) | ORAL | Status: DC | PRN

## 2016-01-07 MED ORDER — OXYCODONE-ACETAMINOPHEN 5-325 MG PO TABS
2.0000 | ORAL_TABLET | Freq: Four times a day (QID) | ORAL | Status: DC | PRN
  Administered 2016-01-07: 2 via ORAL
  Filled 2016-01-07: qty 2

## 2016-01-07 NOTE — Progress Notes (Signed)
Patient ID: Kristina Leon, female   DOB: 03-11-1986, 30 y.o.   MRN: [id number] QADIRA FELLA Jan 17, 1986 [id number]   1 Day Post-Op s/p Procedure(s): MYOMECTOMY-abdominal  Subjective: Patient reports feels well, no acute distress, pain severity reported mild, Yes.   taking PO, foley catheter out, Yes.   voiding, Yes.   ambulating, No. passing flatus  Objective: Vital signs in last 24 hours: Temp:  [98 F (36.7 C)-98.8 F (37.1 C)] 98.8 F (37.1 C) (06/07 0551) Pulse Rate:  [69-100] 69 (06/07 0551) Resp:  [13-21] 20 (06/07 0551) BP: (105-133)/(53-87) 105/57 mmHg (06/07 0551) SpO2:  [95 %-100 %] 99 % (06/07 0551) Weight:  [204 lb (92.534 kg)] 204 lb (92.534 kg) (06/06 1100) Last BM Date: 01/05/16  EXAM General: awake, alert and no distress Resp: clear to auscultation bilaterally Cardio: regular rate and rhythm GI: soft, minimal tenderness, bowel sounds active, dressing dry Lower Extremities: Without swelling or tenderness  Lab Results:   Recent Labs  01/07/16 0528  WBC 10.8*  HGB 11.1*  HCT 34.3*  PLT 325    Assessment: s/p Procedure(s): MYOMECTOMY-abdominal: stable and progressing well  Plan: Continue routine post operative care, Advance diet Encourage ambulation Advance to PO medication Discontinue IV fluids  Reassess for discharge later today  LOS: 1 day    Anastasio Auerbach MD, 7:17 AM 01/07/2016

## 2016-01-07 NOTE — Progress Notes (Signed)
Discharge teaching complete. Pt understood all instructions and did not have any questions. Pt ambulated out of the hospital and discharged home to family.  

## 2016-01-07 NOTE — Discharge Instructions (Signed)
Postoperative Instructions Myomectomy  Dr. Phineas Real and the nursing staff have discussed postoperative instructions with you.  If you have any questions please ask them before you leave the hospital, or call Dr Elisabeth Most office at [phone].    We would like to emphasize the following instructions:   ? Call the office to make your follow-up appointment as recommended by Dr Phineas Real (usually 2 weeks).  ? You were given a prescription, or one was ordered for you at the pharmacy you designated.  Get that prescription filled and take the medication according to instructions.  ? You may eat a regular diet, but slowly until you start having bowel movements.  ? Drink plenty of water daily.  ? Nothing in the vagina (intercourse, douching, objects of any kind) until released by Dr Phineas Real.  ? No driving for two weeks.  Wait to be cleared by Dr Phineas Real at your first post op check.  Car rides (short) are ok after several days at home, as long as you are not having significant pain, but no traveling out of town.  ? You may shower, but no baths.  Walking up and down stairs is ok.  No heavy lifting, prolonged standing, repeated bending or any working out until your first post op check.  ? Rest frequently, listen to your body and do not push yourself and overdo it.  ? Call if:  o Your pain medication does not seem strong enough. o Worsening pain or abdominal bloating o Persistent nausea or vomiting o Difficulty with urination or bowel movements. o Temperature of 101 degrees or higher. o Bleeding heavier then staining (clots or period type flow). o Incisions become red, tender or begin to drain. o You have any questions or concerns.

## 2016-01-07 NOTE — Progress Notes (Signed)
Patient ID: Kristina Leon, female   DOB: 23-Apr-1986, 30 y.o.   MRN: [id number] Kristina Leon 1986-02-14 [id number]   1 Day Post-Op Procedure(s) (LRB): MYOMECTOMY-abdominal (N/A)  Subjective: Patient reports has no problems, no acute distress, pain severity reported mild, Yes.   taking PO, foley catheter out, Yes.   voiding, Yes.   ambulating, Yes.   passing flatus  Objective: Vital signs in last 24 hours: Temp:  [98 F (36.7 C)-98.8 F (37.1 C)] 98.4 F (36.9 C) (06/07 1202) Pulse Rate:  [69-100] 95 (06/07 1202) Resp:  [16-21] 17 (06/07 1202) BP: (105-123)/(53-73) 115/68 mmHg (06/07 1202) SpO2:  [98 %-100 %] 100 % (06/07 1202) Last BM Date: 01/05/16    EXAM General: awake, alert and no distress GI: soft, minimal tenderness, dressings dry intac   Lab Results:   Recent Labs  01/07/16 0528  WBC 10.8*  HGB 11.1*  HCT 34.3*  PLT 325    Assessment: s/p Procedure(s): MYOMECTOMY-abdominal: progressing well, ready for discharge.   Plan: Discharge home today.  Precautions, instructions and follow up were discussed with the patient.  Prescriptions provided per AVS.  Patient to call the office to arrange a post-operative appointmant in 2 weeks.    Anastasio Auerbach MD, 1:59 PM 01/07/2016

## 2016-01-13 ENCOUNTER — Telehealth: Payer: Self-pay | Admitting: *Deleted

## 2016-01-13 MED ORDER — TRAMADOL HCL 50 MG PO TABS: 50.0000 mg | ORAL_TABLET | Freq: Four times a day (QID) | ORAL | Status: DC | PRN

## 2016-01-13 NOTE — Telephone Encounter (Signed)
Left on voicemail Rx will be call in.

## 2016-01-13 NOTE — Telephone Encounter (Signed)
We can try Ultram 50 mg every 6 hours when necessary pain #25 no refill

## 2016-01-13 NOTE — Telephone Encounter (Signed)
Pt called requesting refill on percocet, when I called and spoke with her and asked her about her pain medication, surgery on 01/06/16 myomectomy. Pt finished her percocet yesterday, states pain level 4/5 c/o discomfort at incision site, states no drainage, pain comes and goes, no fever, eating and voiding fine, no urinary symptoms, has no tried any over the counter medication for discomfort. Pt asked what do you recommend over the counter or willing to prescribed another Rx. Pt doesn't want Rx for percocet. Please advise

## 2016-01-22 ENCOUNTER — Ambulatory Visit (INDEPENDENT_AMBULATORY_CARE_PROVIDER_SITE_OTHER): Payer: 59 | Admitting: Gynecology

## 2016-01-22 ENCOUNTER — Encounter: Payer: Self-pay | Admitting: Gynecology

## 2016-01-22 VITALS — BP 110/64

## 2016-01-22 DIAGNOSIS — Z9889 Other specified postprocedural states: Secondary | ICD-10-CM

## 2016-01-22 NOTE — Patient Instructions (Signed)
Slowly resume normal activities. Follow up in 2 weeks for your next postoperative visit

## 2016-01-22 NOTE — Progress Notes (Signed)
    Kristina Leon 28-Nov-1985 [id number]        30 y.o.  G0P0000 presents for her first postoperative visit at 2 weeks status post multiple myomectomy. Has done well with declining discomfort.  Past medical history,surgical history, problem list, medications, allergies, family history and social history were all reviewed and documented in the EPIC chart.  Directed ROS with pertinent positives and negatives documented in the history of present illness/assessment and plan.  Exam: [name] assistant Filed Vitals:   01/22/16 1216  BP: 110/64   General appearance:  Normal Abdomen soft with minimal tenderness. Incision healed nicely. Pelvic bimanual with single finger due to virginal status without gross masses or tenderness  Assessment/Plan:  30 y.o. G0P0000 with normal postop visit status post abdominal myomectomy. Patient doing well. She'll slowly resume activities and follow up with me in 2 weeks for next postoperative visit.    Anastasio Auerbach MD, 12:48 PM 01/22/2016

## 2016-02-11 ENCOUNTER — Ambulatory Visit (INDEPENDENT_AMBULATORY_CARE_PROVIDER_SITE_OTHER): Payer: 59 | Admitting: Gynecology

## 2016-02-11 ENCOUNTER — Encounter: Payer: Self-pay | Admitting: Gynecology

## 2016-02-11 VITALS — BP 118/74

## 2016-02-11 DIAGNOSIS — Z9889 Other specified postprocedural states: Secondary | ICD-10-CM

## 2016-02-11 NOTE — Patient Instructions (Signed)
Slowly resume all normal activities.  Follow up in March 2018 when you're due for your annual exam.

## 2016-02-11 NOTE — Progress Notes (Signed)
    Kristina Leon Dec 15, 1985 [id number]        30 y.o.  G0P0000 presents for her postoperative visit status post abdominal myomectomy. Doing well. Is scheduled to return to work next week but feels she needs another week because she does a lot of lifting and strenuous activity.  Past medical history,surgical history, problem list, medications, allergies, family history and social history were all reviewed and documented in the EPIC chart.  Directed ROS with pertinent positives and negatives documented in the history of present illness/assessment and plan.  Exam: Kristina Leon assistant Filed Vitals:   02/11/16 1222  BP: 118/74   General appearance:  Normal Abdominal incision well-healed without masses guarding rebound  Assessment/Plan:  30 y.o. G0P0000 with normal postoperative visit. Will slowly resume all normal activities. We'll extend recovery 1 more week until return to work. Will follow up in March 2018 when due for annual exam, sooner if any issues.    Anastasio Auerbach MD, 12:30 PM 02/11/2016

## 2016-10-26 ENCOUNTER — Encounter (HOSPITAL_COMMUNITY): Payer: Self-pay | Admitting: Emergency Medicine

## 2016-10-26 ENCOUNTER — Emergency Department (HOSPITAL_COMMUNITY): Payer: 59

## 2016-10-26 ENCOUNTER — Inpatient Hospital Stay (HOSPITAL_COMMUNITY)
Admission: EM | Admit: 2016-10-26 | Discharge: 2016-10-28 | DRG: 690 | Disposition: A | Discharge status: Home / Self Care | Payer: 59 | Attending: Internal Medicine | Admitting: Internal Medicine

## 2016-10-26 DIAGNOSIS — R197 Diarrhea, unspecified: Secondary | ICD-10-CM

## 2016-10-26 DIAGNOSIS — N179 Acute kidney failure, unspecified: Secondary | ICD-10-CM | POA: Diagnosis present

## 2016-10-26 DIAGNOSIS — N12 Tubulo-interstitial nephritis, not specified as acute or chronic: Principal | ICD-10-CM | POA: Diagnosis present

## 2016-10-26 DIAGNOSIS — Z87442 Personal history of urinary calculi: Secondary | ICD-10-CM

## 2016-10-26 DIAGNOSIS — R112 Nausea with vomiting, unspecified: Secondary | ICD-10-CM

## 2016-10-26 HISTORY — DX: Iron deficiency anemia, unspecified: D50.9

## 2016-10-26 LAB — COMPREHENSIVE METABOLIC PANEL
ALBUMIN: 3.7 g/dL (ref 3.5–5.0)
ALT: 12 U/L — AB (ref 14–54)
AST: 14 U/L — AB (ref 15–41)
Alkaline Phosphatase: 68 U/L (ref 38–126)
Anion gap: 13 (ref 5–15)
BUN: 23 mg/dL — ABNORMAL HIGH (ref 6–20)
CHLORIDE: 99 mmol/L — AB (ref 101–111)
CO2: 22 mmol/L (ref 22–32)
CREATININE: 2.33 mg/dL — AB (ref 0.44–1.00)
Calcium: 8.9 mg/dL (ref 8.9–10.3)
GFR calc Af Amer: 31 mL/min — ABNORMAL LOW (ref >60)
GFR, EST NON AFRICAN AMERICAN: 27 mL/min — AB (ref >60)
Glucose, Bld: 106 mg/dL — ABNORMAL HIGH (ref 65–99)
POTASSIUM: 4.3 mmol/L (ref 3.5–5.1)
Sodium: 134 mmol/L — ABNORMAL LOW (ref 135–145)
Total Bilirubin: 1 mg/dL (ref 0.3–1.2)
Total Protein: 7.9 g/dL (ref 6.5–8.1)

## 2016-10-26 LAB — URINALYSIS, ROUTINE W REFLEX MICROSCOPIC
BILIRUBIN URINE: NEGATIVE
Glucose, UA: NEGATIVE mg/dL
Ketones, ur: NEGATIVE mg/dL
LEUKOCYTES UA: NEGATIVE
NITRITE: NEGATIVE
PROTEIN: NEGATIVE mg/dL
Specific Gravity, Urine: 1.009 (ref 1.005–1.030)
pH: 5 (ref 5.0–8.0)

## 2016-10-26 LAB — CBC
HEMATOCRIT: 40 % (ref 36.0–46.0)
HEMOGLOBIN: 13.2 g/dL (ref 12.0–15.0)
MCH: 26.1 pg (ref 26.0–34.0)
MCHC: 33 g/dL (ref 30.0–36.0)
MCV: 79.1 fL (ref 78.0–100.0)
Platelets: 357 10*3/uL (ref 150–400)
RBC: 5.06 MIL/uL (ref 3.87–5.11)
RDW: 15 % (ref 11.5–15.5)
WBC: 10 10*3/uL (ref 4.0–10.5)

## 2016-10-26 LAB — CREATININE, URINE, RANDOM: Creatinine, Urine: 135.78 mg/dL

## 2016-10-26 LAB — DIFFERENTIAL
BASOS PCT: 0 %
Basophils Absolute: 0 10*3/uL (ref 0.0–0.1)
EOS ABS: 0.2 10*3/uL (ref 0.0–0.7)
Eosinophils Relative: 2 %
Lymphocytes Relative: 17 %
Lymphs Abs: 1.7 10*3/uL (ref 0.7–4.0)
MONO ABS: 1.1 10*3/uL — AB (ref 0.1–1.0)
MONOS PCT: 11 %
NEUTROS ABS: 7 10*3/uL (ref 1.7–7.7)
Neutrophils Relative %: 70 %

## 2016-10-26 LAB — SODIUM, URINE, RANDOM: SODIUM UR: 27 mmol/L

## 2016-10-26 LAB — I-STAT BETA HCG BLOOD, ED (MC, WL, AP ONLY)

## 2016-10-26 LAB — LIPASE, BLOOD: LIPASE: 17 U/L (ref 11–51)

## 2016-10-26 MED ORDER — HYDROCODONE-ACETAMINOPHEN 10-325 MG PO TABS
1.0000 | ORAL_TABLET | Freq: Four times a day (QID) | ORAL | Status: DC | PRN
  Administered 2016-10-26 – 2016-10-28 (×5): 1 via ORAL
  Filled 2016-10-26 (×5): qty 1

## 2016-10-26 MED ORDER — SODIUM CHLORIDE 0.9 % IV SOLN
INTRAVENOUS | Status: DC
  Administered 2016-10-26 – 2016-10-27 (×3): via INTRAVENOUS

## 2016-10-26 MED ORDER — IOPAMIDOL (ISOVUE-300) INJECTION 61%
INTRAVENOUS | Status: AC
  Filled 2016-10-26: qty 100

## 2016-10-26 MED ORDER — MORPHINE SULFATE (PF) 4 MG/ML IV SOLN
4.0000 mg | Freq: Once | INTRAVENOUS | Status: AC
  Administered 2016-10-26: 4 mg via INTRAVENOUS
  Filled 2016-10-26: qty 1

## 2016-10-26 MED ORDER — DEXTROSE 5 % IV SOLN
1.0000 g | INTRAVENOUS | Status: DC
  Administered 2016-10-26 – 2016-10-27 (×2): 1 g via INTRAVENOUS
  Filled 2016-10-26 (×2): qty 10

## 2016-10-26 MED ORDER — SODIUM CHLORIDE 0.9 % IV SOLN
INTRAVENOUS | Status: AC
  Administered 2016-10-26 (×2): via INTRAVENOUS

## 2016-10-26 MED ORDER — ONDANSETRON HCL 4 MG/2ML IJ SOLN
4.0000 mg | Freq: Once | INTRAMUSCULAR | Status: AC
  Administered 2016-10-26: 4 mg via INTRAVENOUS
  Filled 2016-10-26: qty 2

## 2016-10-26 MED ORDER — SODIUM CHLORIDE 0.9 % IV BOLUS (SEPSIS)
1000.0000 mL | Freq: Once | INTRAVENOUS | Status: AC
  Administered 2016-10-26: 1000 mL via INTRAVENOUS

## 2016-10-26 MED ORDER — ONDANSETRON HCL 4 MG/2ML IJ SOLN
4.0000 mg | Freq: Four times a day (QID) | INTRAMUSCULAR | Status: DC | PRN
  Administered 2016-10-27: 4 mg via INTRAVENOUS
  Filled 2016-10-26: qty 2

## 2016-10-26 MED ORDER — HYDROMORPHONE HCL 1 MG/ML IJ SOLN: 0.5000 mg | INTRAMUSCULAR | Status: DC | PRN

## 2016-10-26 MED ORDER — IOHEXOL 300 MG/ML  SOLN
15.0000 mL | INTRAMUSCULAR | Status: AC
  Administered 2016-10-26: 15 mL via ORAL

## 2016-10-26 MED ORDER — MORPHINE SULFATE (PF) 4 MG/ML IV SOLN
4.0000 mg | Freq: Once | INTRAVENOUS | Status: AC
Start: 2016-10-26 — End: 2016-10-26
  Administered 2016-10-26: 4 mg via INTRAVENOUS
  Filled 2016-10-26: qty 1

## 2016-10-26 MED ORDER — ONDANSETRON HCL 4 MG PO TABS: 4.0000 mg | ORAL_TABLET | Freq: Four times a day (QID) | ORAL | Status: DC | PRN

## 2016-10-26 MED ORDER — HYDROMORPHONE HCL 1 MG/ML IJ SOLN
0.5000 mg | Freq: Once | INTRAMUSCULAR | Status: AC
  Administered 2016-10-26: 0.5 mg via INTRAVENOUS
  Filled 2016-10-26: qty 1

## 2016-10-26 NOTE — Progress Notes (Signed)
Pharmacy Antibiotic Note  Kristina Leon is a 31 y.o. female admitted on 10/26/2016 with UTI.  Pharmacy has been consulted for ceftriaxone dosing.  Plan: Ceftriaxone 1g IV q24h F/u clinical progress, LOT Not renally adjusted - Rx will s/o consult  Height: 5\' 5"  (165.1 cm) Weight: 195 lb (88.5 kg) IBW/kg (Calculated) : 57  Temp (24hrs), Avg:99.2 F (37.3 C), Min:99.2 F (37.3 C), Max:99.2 F (37.3 C)   Recent Labs Lab 10/26/16 0534  WBC 10.0  CREATININE 2.33*    Estimated Creatinine Clearance: 38.8 mL/min (A) (by C-G formula based on SCr of 2.33 mg/dL (H)).    Allergies  Allergen Reactions  . Pork-Derived Products     Pt is ARABIC  WANTS NO PORK  Or gellatin     Elicia Lamp, PharmD, BCPS Clinical Pharmacist 10/26/2016 12:43 PM

## 2016-10-26 NOTE — Progress Notes (Signed)
Patient trasfered from ED to 5W21 via wheelchair; alert and oriented x 4;  complaints of pain bilateral flank; IV in RAC running fluids@125  cc/hr; skin intact. Orient patient to room and unit;  gave patient care guide; instructed how to use the call bell and  fall risk precautions. Will continue to monitor the patient.

## 2016-10-26 NOTE — ED Provider Notes (Signed)
TIME SEEN: 5:08 AM  CHIEF COMPLAINT: Bilateral flank pain, abdominal pain  HPI: Patient is a 31 year old female with history of migraines, kidney stones who presents to the emergency department with complaints of bilateral flank pain that is sharp that radiates into the abdomen that started 4 days ago. Pain is moderate in nature without aggravating or alleviating factors. Also has vomiting that started 3 days ago. Has had one episode of vomiting that had streaks of blood in it. Denies frank hematemesis. Reports that prior to this starting she had bloody diarrhea for 4 weeks. Was seen by Dr. Britta Mccreedy at [facility] who is a gastroenterologist. Initially patient was placed on omeprazole and Phenergan and had a CT scan as an outpatient which was negative. States she went back and was placed on Cipro and Flagyl for possible infectious diarrhea on 10/21/16. Since starting this medication her diarrhea has resolved but she started having pain and vomiting. Reports subjective fevers as well. Denies dysuria, hematuria, vaginal bleeding or discharge. She has no recent international travel, sick contacts or bad food exposure. No history of endoscopy or colonoscopy. No history of inflammatory bowel disease. She has had a previous myomectomy but no other abdominal surgeries. Last visit. Was March 3.  PCP - Dr. York Ram  ROS: See HPI Constitutional: no fever  Eyes: no drainage  ENT: no runny nose   Cardiovascular:  no chest pain  Resp: no SOB  GI: no vomiting GU: no dysuria Integumentary: no rash  Allergy: no hives  Musculoskeletal: no leg swelling  Neurological: no slurred speech ROS otherwise negative  PAST MEDICAL HISTORY/PAST SURGICAL HISTORY:  Past Medical History:  Diagnosis Date  . Anemia   . Headache    Migraines  . Kidney stones    5 years ago    MEDICATIONS:  Prior to Admission medications   Medication Sig Start Date End Date Taking? Authorizing Provider  Biotin w/  Vitamins C & E (HAIR/SKIN/NAILS PO) Take 1 tablet by mouth 2 (two) times daily.    Historical Provider, MD  ibuprofen (ADVIL,MOTRIN) 200 MG tablet Take 400 mg by mouth every 6 (six) hours as needed for headache.    Historical Provider, MD  IRON PO Take 1 tablet by mouth daily.    Historical Provider, MD    ALLERGIES:  Allergies  Allergen Reactions  . Pork-Derived Products     Pt is ARABIC  WANTS NO PORK  Or gellatin     SOCIAL HISTORY:  Social History  Substance Use Topics  . Smoking status: Never Smoker  . Smokeless tobacco: Never Used  . Alcohol use No    FAMILY HISTORY: Family History  Problem Relation Age of Onset  . Anemia Mother   . Anemia Sister   . Breast cancer Maternal Aunt     EXAM: BP (!) 132/95 (BP Location: Right Arm)   Pulse 100   Temp 99.2 F (37.3 C) (Oral)   Resp 18   Ht 5\' 5"  (1.651 m)   Wt 195 lb (88.5 kg)   LMP 10/02/2016 (Exact Date)   SpO2 100%   BMI 32.45 kg/m  CONSTITUTIONAL: Alert and oriented and responds appropriately to questions. Well-appearing; well-nourished HEAD: Normocephalic EYES: Conjunctivae clear, pupils appear equal, EOMI ENT: normal nose; moist mucous membranes NECK: Supple, no meningismus, no nuchal rigidity, no LAD  CARD: RRR; S1 and S2 appreciated; no murmurs, no clicks, no rubs, no gallops RESP: Normal chest excursion without splinting or tachypnea; breath sounds clear and equal bilaterally;  no wheezes, no rhonchi, no rales, no hypoxia or respiratory distress, speaking full sentences ABD/GI: Normal bowel sounds; non-distended; soft, diffusely tender throughout the abdomen, no rebound, no guarding, no peritoneal signs, no hepatosplenomegaly BACK:  The back appears normal and is non-tender to palpation, there is no CVA tenderness, no midline spinal tenderness or step-off or deformity EXT: Normal ROM in all joints; non-tender to palpation; no edema; normal capillary refill; no cyanosis, no calf tenderness or swelling    SKIN:  Normal color for age and race; warm; no rash NEURO: Moves all extremities equally PSYCH: The patient's mood and manner are appropriate. Grooming and personal hygiene are appropriate.  MEDICAL DECISION MAKING: Patient here with diffuse abdominal pain, subjective fevers, vomiting. Hemodynamically stable here. Recently reports a negative CT scan but I am unable to obtain these records from her outpatient provider at this time. Given pain is now new with new vomiting or feel she will need repeat imaging. We'll obtain labs, urine. Differential diagnosis includes inflammatory bowel disease, colitis, diverticulitis, kidney stones, pyelonephritis, appendicitis, pancreatitis, cholecystitis. We'll give IV fluids, pain and nausea medicine and reassess.  ED PROGRESS: 6:25 AM  Patient's labs show creatinine of 2.33 and a BUN of 23. No old for comparison. She states she had blood work to beginning of March by her gastroenterologist at [facility] and she was never told that her cramping was elevated. She does state that she was taking NSAIDs regularly but was told to stop this in the beginning of March and she is 11 taking Tylenol. She denies history of chronic kidney disease. Urine does show small hemoglobin, 60-30 white blood cells and many bacteria. We'll send urine culture. She is not having urinary symptoms. Otherwise labs are unremarkable. CT scan is pending.   7:40 AM  Signed out to Dr. Wilson Singer to follow up on CTAP.  I feel she will need admission after CT scan. Patient has been updated with this plan.   I reviewed all nursing notes, vitals, pertinent previous records, EKGs, lab and urine results, imaging (as available).     [place], DO 10/26/16 [phone]

## 2016-10-26 NOTE — ED Notes (Signed)
EDP at bedside  

## 2016-10-26 NOTE — ED Triage Notes (Signed)
Pt to ED for flank/abdominal pain. Pt states pain is 10/10. Pt was seen by her GI doctor on Wednesday and was given antibiotics. Pt states her flank pain started after taking the medication and so she stopped taking them on Saturday. Pt said she didn't eat yesterday and had 3 episodes of hematemesis. Pt in NAD during triage.

## 2016-10-26 NOTE — H&P (Signed)
Date: 10/26/2016               Patient Name:  Kristina Leon MRN: [id number]  DOB: 1985-08-07 Age / Sex: 31 y.o., female   PCP: Harvie Junior, MD         Medical Service: Internal Medicine Teaching Service         Attending Physician: Dr. Aldine Contes    First Contact: Dr. Asencion Partridge Pager: [phone]  Second Contact: Dr. Shela Leff Pager: [phone]       After Hours (After 5p/  First Contact Pager: [phone]  weekends / holidays): Second Contact Pager: [phone]   Chief Complaint: Bilateral flank pain, nausea, vomiting  History of Present Illness: Kristina Leon is a 30 y.o. woman with PMH iron deficiency anemia and nephrolithiasis in 2009 who presented for flank pain, nausea, and vomiting. She reports her symptoms have been progressive over the past 3-4 days. She has been unable to keep down food but has been drinking fluids. She states that she vomited all day yesterday and at the end of the day noticed a few flecks of blood in her vomit. She endorses dizziness and subjective fevers. She reports she had been sick with persistent diarrhea for about 3-6 weeks up until last week. She followed up with a GI doctor last Thursday and was prescribed Cipro and Flagyl and her symptoms developed after taking the first dose. Upon further questioning she reports that her nausea has actually been present for several weeks. She had also been taking Ibuprofen twice daily for several weeks until the GI doctor told her to stop. Her flank pain is constant, severe, and occasionally radiates to her umbilicus. She denies dysuria, chest pain, dyspnea, urinary changes, constipation, or recent sick contacts.  In the ED she was afebrile and hemodynamically stable with significant abdominal pain on exam. CT A/P without contrast revealed no acute findings, no renal stones or hydronephrosis. Labs remarkable for BUN 23, creatinine 2.33, and UA showed many bacteria, 6-30 WBCs, and negative nitrites. Other  pertinent negatives include negative lipase, normal CBC, normal LFTs, and negative hCG. She received 1L IV fluids and morphine for pain control. IMTS contacted for admission.  Meds:  Current Meds  Medication Sig  . cyclobenzaprine (FLEXERIL) 10 MG tablet Take 10 mg by mouth at bedtime.  . ondansetron (ZOFRAN) 8 MG tablet Take 8 mg by mouth every 8 (eight) hours as needed for nausea or vomiting.   Allergies: Allergies as of 10/26/2016 - Review Complete 10/26/2016  Allergen Reaction Noted  . Pork-derived products  01/06/2016   Past Medical History:  Diagnosis Date  . Headache    Migraines  . Iron deficiency anemia   . Kidney stone    2009   Family History:  Family History  Problem Relation Age of Onset  . Anemia Mother   . Anemia Sister   . Breast cancer Maternal Aunt    Social History:  Social History   Social History  . Marital status: Single    Spouse name: N/A  . Number of children: N/A  . Years of education: N/A   Occupational History  . Not on file.   Social History Main Topics  . Smoking status: Never Smoker  . Smokeless tobacco: Never Used  . Alcohol use No  . Drug use: No  . Sexual activity: No   Other Topics Concern  . Not on file   Social History Narrative   Lives in [place], lives  with parents and several sisters and brothers. From [place] originally, then lived in [place]. Speaks English well.     Review of Systems: A complete ROS was negative except as per HPI.   Physical Exam: Blood pressure (!) 129/95, pulse 86, temperature 99.2 F (37.3 C), temperature source Oral, resp. rate 13, height 5\' 5"  (1.651 m), weight 195 lb (88.5 kg), last menstrual period 10/02/2016, SpO2 100 %.  General appearance: Obese woman resting comfortably in bed, in no distress, conversational HENT: Normocephalic, atraumatic, moist mucous membranes Eyes: PERRL, non-icteric Cardiovascular: Regular rate and rhythm, no murmurs, rubs, gallops Respiratory: Clear to  auscultation bilaterally, normal work of breathing Abdomen: BS+, soft, mild diffuse tenderness to palpation, no rebound, non-distended Back: Normal spine curvature, no paraspinal tenderness, CVA tenderness to percussion bilaterally Extremities: Normal bulk and range of motion, no edema Skin: Warm, dry, intact, normal skin turgor Psych: Normal affect, clear speech   Assessment & Plan by Problem: Active Problems:   Acute kidney injury (HCC)  Bilateral flank pain, with progressive nausea, vomiting, and CVA tenderness. UA with many bacteria and some leukocytes. CT A/P non contrast not impressive for acute pathology but without contrast is not sensitive for inflammation or infection, no nephrolithiasis or hydronephrosis. Clinically history is most consistent with pyelonephritis. No issues voiding or urinary retention.  -- Ceftriaxone daily  -- Norco PRN for pain -- Zofran PRN for nausea -- IVF fluids overnight  Acute kidney injury, presented with Cr 2.33 and BUN 23 and 2-3 days of poor po intake in the setting of profuse N/V concerning for pre-renal / dehydration etiology, however BUN:Cr less consistent with this. Also she does not clinically appear dehydrated on exam, but has received IV fluids prior to our eval. Relatively recent NSAID overuse taking Ibuprofen twice daily "for weeks" up until last week. AIN secondary to Cipro or Flagyl possible as well.  -- NS at 125 cc/hr overnight -- Trend BMP -- Check urine creatinine, urine Na, HIV ab  FEN/GI: Regular diet, replete electrolytes as needed  DVT ppx: SCDs  Code status: Full code  Dispo: Admit patient to Observation with expected length of stay less than 2 midnights.  Signed: Asencion Partridge, MD 10/26/2016, 4:42 PM  Pager: [phone]

## 2016-10-27 DIAGNOSIS — E669 Obesity, unspecified: Secondary | ICD-10-CM

## 2016-10-27 DIAGNOSIS — R197 Diarrhea, unspecified: Secondary | ICD-10-CM | POA: Diagnosis present

## 2016-10-27 DIAGNOSIS — B9689 Other specified bacterial agents as the cause of diseases classified elsewhere: Secondary | ICD-10-CM

## 2016-10-27 DIAGNOSIS — N179 Acute kidney failure, unspecified: Secondary | ICD-10-CM | POA: Diagnosis present

## 2016-10-27 DIAGNOSIS — N12 Tubulo-interstitial nephritis, not specified as acute or chronic: Secondary | ICD-10-CM | POA: Diagnosis present

## 2016-10-27 DIAGNOSIS — Z87442 Personal history of urinary calculi: Secondary | ICD-10-CM | POA: Diagnosis not present

## 2016-10-27 LAB — URINE CULTURE

## 2016-10-27 LAB — BASIC METABOLIC PANEL
Anion gap: 9 (ref 5–15)
BUN: 15 mg/dL (ref 6–20)
CO2: 26 mmol/L (ref 22–32)
Calcium: 8.6 mg/dL — ABNORMAL LOW (ref 8.9–10.3)
Chloride: 102 mmol/L (ref 101–111)
Creatinine, Ser: 1.49 mg/dL — ABNORMAL HIGH (ref 0.44–1.00)
GFR calc Af Amer: 54 mL/min — ABNORMAL LOW (ref >60)
GFR calc non Af Amer: 46 mL/min — ABNORMAL LOW (ref >60)
Glucose, Bld: 91 mg/dL (ref 65–99)
Potassium: 4.2 mmol/L (ref 3.5–5.1)
Sodium: 137 mmol/L (ref 135–145)

## 2016-10-27 LAB — HIV ANTIBODY (ROUTINE TESTING W REFLEX): HIV SCREEN 4TH GENERATION: NONREACTIVE

## 2016-10-27 MED ORDER — ACETAMINOPHEN 325 MG PO TABS
650.0000 mg | ORAL_TABLET | Freq: Four times a day (QID) | ORAL | Status: DC | PRN
  Administered 2016-10-27 (×2): 650 mg via ORAL
  Filled 2016-10-27 (×2): qty 2

## 2016-10-27 MED ORDER — CEFTRIAXONE SODIUM 1 G IJ SOLR
1.0000 g | INTRAMUSCULAR | Status: DC
  Administered 2016-10-28: 1 g via INTRAVENOUS
  Filled 2016-10-27: qty 10

## 2016-10-27 MED ORDER — DEXTROSE 5 % IV SOLN: 1.0000 g | INTRAVENOUS | Status: DC

## 2016-10-27 MED ORDER — SODIUM CHLORIDE 0.9 % IV SOLN: INTRAVENOUS | Status: AC

## 2016-10-27 NOTE — Progress Notes (Signed)
Subjective: Ms. Hinchcliff is feeling overall better today. Her pain and nausea has improved but is still present. She was able to eat and drink small amounts last night. This morning she had 1 episode of emesis. She required 3 doses of prn Norco overnight.  Objective: Vital signs in last 24 hours: Vitals:   10/26/16 1630 10/26/16 1722 10/26/16 2157 10/27/16 0555  BP: 123/90 129/77 114/68 124/64  Pulse: 94 90 71 89  Resp:  16 18 20   Temp:  98 F (36.7 C)  99.1 F (37.3 C)  TempSrc:  Oral  Oral  SpO2: 98% 100% 100% 100%  Weight:      Height:        Intake/Output Summary (Last 24 hours) at 10/27/16 1829 Last data filed at 10/26/16 1831  Gross per 24 hour  Intake           437.08 ml  Output                0 ml  Net           437.08 ml   Physical Exam General appearance: Obese woman resting comfortably in bed, in no distress, conversational HENT: Normocephalic, moist mucous membranes Cardiovascular: Regular rate and rhythm, no murmurs, rubs, gallops Respiratory: Clear to auscultation bilaterally, normal work of breathing Abdomen: BS+, soft, mild tenderness in lower quadrants, non-distended Back: Normal spine curvature, no paraspinal tenderness, mild R>L CVA tenderness Extremities: Normal bulk and range of motion, no edema Skin: Warm, dry, intact, normal skin turgor  Labs / Imaging / Procedures: CBC Latest Ref Rng & Units 10/26/2016 01/07/2016 12/26/2015  WBC 4.0 - 10.5 K/uL 10.0 10.8(H) 6.1  Hemoglobin 12.0 - 15.0 g/dL 13.2 11.1(L) 12.6  Hematocrit 36.0 - 46.0 % 40.0 34.3(L) 38.1  Platelets 150 - 400 K/uL 357 325 341   BMP Latest Ref Rng & Units 10/27/2016 10/26/2016  Glucose 65 - 99 mg/dL 91 106(H)  BUN 6 - 20 mg/dL 15 23(H)  Creatinine 0.44 - 1.00 mg/dL 1.49(H) 2.33(H)  Sodium 135 - 145 mmol/L 137 134(L)  Potassium 3.5 - 5.1 mmol/L 4.2 4.3  Chloride 101 - 111 mmol/L 102 99(L)  CO2 22 - 32 mmol/L 26 22  Calcium 8.9 - 10.3 mg/dL 8.6(L) 8.9   Assessment/Plan: Ms. KAMARYN GRIMLEY is a 31 y.o. woman with no significant PMH admitted for AKI and pyelonephritis.   Active Problems:   Acute kidney injury ([place])  Pyelonephritis, presented with progressive flank pain, R>L CVA tenderness and nausea, vomiting. UA with many bacteria and some leukocytes. CT A/P non contrast not impressive for acute pathology but without contrast is not sensitive for inflammation or infection, no nephrolithiasis or hydronephrosis. Clinically history is most consistent with pyelonephritis. No issues voiding or urinary retention. She seems to be improving today with pain and nausea under control. -- Ceftriaxone 1g daily, likely transition to oral antibiotics tomorrow -- Norco 1 tablet PRN for pain -- Tylenol PRN for pain  -- Zofran PRN for nausea -- 100 cc IVF fluids today -- Follow urine culture - multiple species present  Acute kidney injury, improving, presented with Cr 2.33 and BUN 23 and 2-3 days of poor po intake in the setting of profuse N/V concerning for pre-renal / dehydration etiology. Her creatinine is 1.49 today after receiving IV fluids overnight and yesterday -- NS at 100 cc/hr today -- Trend BMP  FEN/GI: Regular diet, replete electrolytes as needed  DVT ppx: SCDs  Dispo: Anticipated discharge in approximately  1 day(s).    LOS: 0 days   Asencion Partridge, MD 10/27/2016, 6:55 AM Pager: [phone]

## 2016-10-28 DIAGNOSIS — N12 Tubulo-interstitial nephritis, not specified as acute or chronic: Principal | ICD-10-CM

## 2016-10-28 DIAGNOSIS — N179 Acute kidney failure, unspecified: Secondary | ICD-10-CM

## 2016-10-28 LAB — BASIC METABOLIC PANEL
ANION GAP: 9 (ref 5–15)
BUN: 13 mg/dL (ref 6–20)
CALCIUM: 8.6 mg/dL — AB (ref 8.9–10.3)
CO2: 28 mmol/L (ref 22–32)
Chloride: 101 mmol/L (ref 101–111)
Creatinine, Ser: 1.22 mg/dL — ABNORMAL HIGH (ref 0.44–1.00)
GFR, EST NON AFRICAN AMERICAN: 59 mL/min — AB (ref >60)
Glucose, Bld: 118 mg/dL — ABNORMAL HIGH (ref 65–99)
POTASSIUM: 3.6 mmol/L (ref 3.5–5.1)
Sodium: 138 mmol/L (ref 135–145)

## 2016-10-28 MED ORDER — IBUPROFEN 400 MG PO TABS
400.0000 mg | ORAL_TABLET | Freq: Four times a day (QID) | ORAL | Status: DC | PRN
  Administered 2016-10-28: 400 mg via ORAL
  Filled 2016-10-28: qty 1

## 2016-10-28 MED ORDER — ACETAMINOPHEN 325 MG PO TABS: 650.0000 mg | ORAL_TABLET | Freq: Four times a day (QID) | ORAL | 0 refills | Status: DC | PRN

## 2016-10-28 MED ORDER — CEPHALEXIN 500 MG PO CAPS: 500.0000 mg | ORAL_CAPSULE | Freq: Two times a day (BID) | ORAL | 0 refills | Status: DC

## 2016-10-28 MED ORDER — IBUPROFEN 400 MG PO TABS: 400.0000 mg | ORAL_TABLET | Freq: Three times a day (TID) | ORAL | 0 refills | Status: DC | PRN

## 2016-10-28 NOTE — Discharge Summary (Signed)
Name: Kristina Leon MRN: [id number] DOB: 1986-03-28 31 y.o. PCP: Harvie Junior, MD  Date of Admission: 10/26/2016  4:49 AM Date of Discharge: 10/28/2016 Attending Physician: Aldine Contes, MD  Discharge Diagnosis: 1. Pyelonephritis 2. AKI  Principal Problem:   Pyelonephritis Active Problems:   Acute kidney injury ([place])   AKI (acute kidney injury) ([place])   Discharge Medications: Allergies as of 10/28/2016      Reactions   Pork-derived Products    Pt is ARABIC  WANTS NO PORK  Or gellatin       Medication List    TAKE these medications   acetaminophen 325 MG tablet Commonly known as:  TYLENOL Take 2 tablets (650 mg total) by mouth every 6 (six) hours as needed for moderate pain.   cephALEXin 500 MG capsule Commonly known as:  KEFLEX Take 1 capsule (500 mg total) by mouth 2 (two) times daily. Start taking on:  10/29/2016   cyclobenzaprine 10 MG tablet Commonly known as:  FLEXERIL Take 10 mg by mouth at bedtime.   ibuprofen 400 MG tablet Commonly known as:  ADVIL,MOTRIN Take 1 tablet (400 mg total) by mouth every 8 (eight) hours as needed for headache, moderate pain or cramping.   ondansetron 8 MG tablet Commonly known as:  ZOFRAN Take 8 mg by mouth every 8 (eight) hours as needed for nausea or vomiting.      Disposition and follow-up:   Kristina Leon was discharged from [facility] in Stable condition.  At the hospital follow up visit please address:  1.  Pyelonephritis - assess for resolution of bilateral flank pain, pain control with Tylenol and Ibuprofen, or any recurrence of N/V, ensure completion of course of Keflex 500 mg BID ending on 4/2  AKI - assess renal function, return to baseline  2.  Labs / imaging needed at time of follow-up: BMP  3.  Pending labs/ test needing follow-up: None  Follow-up Appointments: [facility]. Schedule an appointment as soon as possible for a visit.   Why:   Schedule a hosptial follow up visit within 2 weeks Contact information: [place] [phone] [place] by problem list: Principal Problem:   Pyelonephritis Active Problems:   Acute kidney injury ([place])   AKI (acute kidney injury) ([place])   1. Pyelonephritis Kristina Leon is a 31 y.o. woman with PMH iron deficiency anemia and remote nephrolithiasis (2009) who presented to the ED on 3/27 for flank pain, nausea, and vomiting. She reported progressive symptoms over the previous 3-4 days, every since taking Ciprofloxacin and Flagyl prescribed by a provider for diarrhea (since resolved). She presented afebrile and hemodynamically stable but in significant pain. CT abdomen/pevlis without contrast revealed no acute findings, no renal stones or hydronephrosis. Lab work revealed for AKI with Cr 2.3, BUN 23, and UA showed many bacteria, 6-30 WBCs, and negative nitrites. She was started on IV Ceftriaxone, IV fluids, and PRN medication for pain and nausea control. Her symptoms gradually improved, although she continued having difficulty taking in food on 3/28. Her urine culture grew multiple species, poor collection, and was not recollected. Her nausea resolved and she tolerated full meals later on 3/28 and 3/29 and was deemed stable for discharge. She received her third dose of IV Ceftriaxone on 3/29 and was provided a prescription for 4 additional days of antibiotics (Keflex 500 mg BID).  2. AKI - presented with creatinine 2.33 (BUN 23) in the setting of several days of nausea and vomiting and was started on IV fluids. Per outside records her creatinine was 3.2 the day prior. Her creatine improved over the ensuing two hospital days to 1.5 and then 1.2 on day of discharge.   Discharge Vitals:   BP 109/67 (BP Location: Right Arm)   Pulse 82   Temp 98.1 F (36.7 C) (Oral)   Resp 18   Ht 5\' 5"  (1.651 m)   Wt 195 lb (88.5 kg)   LMP 10/02/2016 (Exact Date)    SpO2 99%   BMI 32.45 kg/m   Pertinent Labs, Studies, and Procedures:  BMP Latest Ref Rng & Units 10/28/2016 10/27/2016 10/26/2016  Glucose 65 - 99 mg/dL 118(H) 91 106(H)  BUN 6 - 20 mg/dL 13 15 23(H)  Creatinine 0.44 - 1.00 mg/dL 1.22(H) 1.49(H) 2.33(H)  Sodium 135 - 145 mmol/L 138 137 134(L)  Potassium 3.5 - 5.1 mmol/L 3.6 4.2 4.3  Chloride 101 - 111 mmol/L 101 102 99(L)  CO2 22 - 32 mmol/L 28 26 22   Calcium 8.9 - 10.3 mg/dL 8.6(L) 8.6(L) 8.9    Ref. Range 10/26/2016 05:37  I-stat hCG, quantitative Latest Ref Range: <5 mIU/mL <5.0   Ct Abdomen Pelvis Wo Contrast  Result Date: 10/26/2016 CLINICAL DATA:  Bilateral flank pain for 4 days.  Nausea, vomiting. EXAM: CT ABDOMEN AND PELVIS WITHOUT CONTRAST TECHNIQUE: Multidetector CT imaging of the abdomen and pelvis was performed following the standard protocol without IV contrast. COMPARISON:  None. FINDINGS: Lower chest: No acute abnormality. Hepatobiliary: No focal liver abnormality is seen. No gallstones, gallbladder wall thickening, or biliary dilatation. Pancreas: Unremarkable. No pancreatic ductal dilatation or surrounding inflammatory changes. Spleen: Normal in size without focal abnormality. Adrenals/Urinary Tract: No adrenal abnormality. No focal renal abnormality. No stones or hydronephrosis. Urinary bladder is unremarkable. Stomach/Bowel: Normal appendix. Stomach, large and small bowel grossly unremarkable. Vascular/Lymphatic: No evidence of aneurysm or adenopathy. Shotty retroperitoneal lymph nodes, none pathologically enlarged. Reproductive: Uterus and adnexa unremarkable.  No mass. Other: No free fluid or free air. Musculoskeletal: No acute bony abnormality. IMPRESSION: No acute findings.  No renal or ureteral stones.  No hydronephrosis. Electronically Signed   By: Rolm Baptise M.D.   On: 10/26/2016 10:36    Discharge Instructions: Discharge Instructions    Call MD for:  persistant nausea and vomiting    Complete by:  As directed     Call MD for:  severe uncontrolled pain    Complete by:  As directed    Diet - low sodium heart healthy    Complete by:  As directed    Discharge instructions    Complete by:  As directed    We believe you have had a mild infection in your bladder and kidneys (also known as pyelonephritis) that has been causing your back pain and nausea. We have been treating this with antibiotics, which you should continue taking for 4 more days starting tomorrow morning.  We have prescribed the antibiotic Cephalexin (Keflex) 500 mg twice daily. For nausea you can take the Ondansetron (Zofran) you had prescribed previously.  For pain you should alternate Tylenol and Ibuprofen as needed every 4-6 hours for the next few days. If you pain is getting worse or is no better after 3-4 days you should be evaluated immediately by your primary care doctor. Do not take more than 1200 mg Ibuprofen or 3000 mg Tylenol in one day.  You  kidney function has recovered, but it remains very important for you to stay well-hydrated for the next few days.   Please schedule a follow up appointment with your primary care doctor for within 1-2 weeks.   Increase activity slowly    Complete by:  As directed       Signed: Asencion Partridge, MD 10/28/2016, 11:55 AM   Pager: [phone]

## 2016-10-28 NOTE — Progress Notes (Signed)
Subjective: Reports some continues flank pain overnight, states it feels a bit like menstrual cramps and she is nearing her period. No nausea, ate dinner without incident last night. She ambulated around the unit last night and felt fine on her feet. Interested in going home today.  Objective: Vital signs in last 24 hours: Vitals:   10/27/16 0555 10/27/16 1618 10/27/16 2135 10/28/16 0602  BP: 124/64 112/68 110/65 109/67  Pulse: 89 87 83 82  Resp: 20 20 18 18   Temp: 99.1 F (37.3 C) 98.6 F (37 C) 99.8 F (37.7 C) 98.1 F (36.7 C)  TempSrc: Oral Oral Oral Oral  SpO2: 100% 100% 100% 99%  Weight:      Height:        Intake/Output Summary (Last 24 hours) at 10/28/16 7628 Last data filed at 10/27/16 1130  Gross per 24 hour  Intake           256.67 ml  Output                0 ml  Net           256.67 ml   Physical Exam General appearance: Obese woman resting comfortably in bed, in no distress, conversational HENT: Normocephalic, moist mucous membranes Cardiovascular: Regular rate and rhythm, no murmurs, rubs, gallops Respiratory: Clear to auscultation bilaterally, normal work of breathing Abdomen: BS+, soft, mild tenderness in lower quadrants, non-distended Back: Normal spine curvature, no paraspinal tenderness, mild R>L CVA tenderness Extremities: Normal bulk and range of motion, no edema Skin: Warm, dry, intact, normal skin turgor  Labs / Imaging / Procedures: CBC Latest Ref Rng & Units 10/26/2016 01/07/2016 12/26/2015  WBC 4.0 - 10.5 K/uL 10.0 10.8(H) 6.1  Hemoglobin 12.0 - 15.0 g/dL 13.2 11.1(L) 12.6  Hematocrit 36.0 - 46.0 % 40.0 34.3(L) 38.1  Platelets 150 - 400 K/uL 357 325 341   BMP Latest Ref Rng & Units 10/28/2016 10/27/2016 10/26/2016  Glucose 65 - 99 mg/dL 118(H) 91 106(H)  BUN 6 - 20 mg/dL 13 15 23(H)  Creatinine 0.44 - 1.00 mg/dL 1.22(H) 1.49(H) 2.33(H)  Sodium 135 - 145 mmol/L 138 137 134(L)  Potassium 3.5 - 5.1 mmol/L 3.6 4.2 4.3  Chloride 101 - 111 mmol/L  101 102 99(L)  CO2 22 - 32 mmol/L 28 26 22   Calcium 8.9 - 10.3 mg/dL 8.6(L) 8.6(L) 8.9   Assessment/Plan: Kristina Leon is a 31 y.o. woman with no significant PMH admitted for AKI and pyelonephritis.   Active Problems:   Acute kidney injury ([place])   AKI (acute kidney injury) ([place])  Pyelonephritis, presented with progressive flank pain, R>L CVA tenderness and nausea, vomiting. UA with many bacteria and some leukocytes. CT A/P non contrast not impressive for acute pathology but without contrast is not sensitive for inflammation or infection, no nephrolithiasis or hydronephrosis. Clinically history is most consistent with pyelonephritis. No issues voiding or urinary retention. She seems to be improving today with pain and nausea under control. -- Ceftriaxone 1g daily, last dose this AM, transition to empiric Keflex BID for 4 more days starting tomorrow -- Tylenol PRN for pain  -- Ibuprofen PRN for pain -- Zofran PRN for nausea  Acute kidney injury, improving, presented with Cr 2.33 and BUN 23 and 2-3 days of poor po intake in the setting of profuse N/V concerning for pre-renal / dehydration etiology. Trend is (3.2)>>2.3>>1.5>>1.2. -- Trend BMP  FEN/GI: Regular diet, replete electrolytes as needed  DVT ppx: SCDs  Dispo: Anticipated discharge  today.    LOS: 1 day   Asencion Partridge, MD 10/28/2016, 7:14 AM Pager: [phone]

## 2016-10-28 NOTE — Progress Notes (Signed)
Pt given discharge instructions, prescriptions, and care notes. Pt verbalized understanding AEB no further questions or concerns at this time. IV was discontinued, no redness, pain, or swelling noted at this time. Pt left the floor via wheelchair with staff in stable condition. 

## 2016-10-28 NOTE — Discharge Instructions (Signed)
We believe you have had a mild infection in your bladder and kidneys (also known as pyelonephritis) that has been causing your back pain and nausea. We have been treating this with antibiotics, which you should continue taking for 4 more days starting tomorrow morning.  We have prescribed the antibiotic Cephalexin (Keflex) 500 mg twice daily. For nausea you can take the Ondansetron (Zofran) you had prescribed previously.  For pain you should alternate Tylenol and Ibuprofen as needed every 4-6 hours for the next few days. If you pain is getting worse or is no better after 3-4 days you should be evaluated immediately by your primary care doctor. Do not take more than 1200 mg Ibuprofen or 3000 mg Tylenol in one day.  You kidney function has recovered, but it remains very important for you to stay well-hydrated for the next few days.   Please schedule a follow up appointment with your primary care doctor for within 1-2 weeks.  ---   Pyelonephritis, Adult Pyelonephritis is a kidney infection. The kidneys are the organs that filter a person's blood and move waste out of the bloodstream and into the urine. Urine passes from the kidneys, through the ureters, and into the bladder. There are two main types of pyelonephritis:  Infections that come on quickly without any warning (acute pyelonephritis).  Infections that last for a long period of time (chronic pyelonephritis). In most cases, the infection clears up with treatment and does not cause further problems. More severe infections or chronic infections can sometimes spread to the bloodstream or lead to other problems with the kidneys. What are the causes? This condition is usually caused by:  Bacteria traveling from the bladder to the kidney through infected urine. The urine in the bladder can become infected with bacteria from:  Bladder infection (cystitis).  Inflammation of the prostate gland (prostatitis).  Sexual intercourse, in  females.  Bacteria traveling from the bloodstream to the kidney. What increases the risk? This condition is more likely to develop in:  Pregnant women.  Older people.  People who have diabetes.  People who have kidney stones or bladder stones.  People who have other abnormalities of the kidney or ureter.  People who have a catheter placed in the bladder.  People who have cancer.  People who are sexually active.  Women who use spermicides.  People who have had a prior urinary tract infection. What are the signs or symptoms? Symptoms of this condition include:  Frequent urination.  Strong or persistent urge to urinate.  Burning or stinging when urinating.  Abdominal pain.  Back pain.  Pain in the side or flank area.  Fever.  Chills.  Blood in the urine, or dark urine.  Nausea.  Vomiting. How is this diagnosed? This condition may be diagnosed based on:  Medical history and physical exam.  Urine tests.  Blood tests. You may also have imaging tests of the kidneys, such as an ultrasound or CT scan. How is this treated? Treatment for this condition may depend on the severity of the infection.  If the infection is mild and is found early, you may be treated with antibiotic medicines taken by mouth. You will need to drink fluids to remain hydrated.  If the infection is more severe, you may need to stay in the hospital and receive antibiotics given directly into a vein through an IV tube. You may also need to receive fluids through an IV tube if you are not able to remain hydrated. After your  hospital stay, you may need to take oral antibiotics for a period of time. Other treatments may be required, depending on the cause of the infection. Follow these instructions at home: Medicines   Take over-the-counter and prescription medicines only as told by your health care provider.  If you were prescribed an antibiotic medicine, take it as told by your health  care provider. Do not stop taking the antibiotic even if you start to feel better. General instructions   Drink enough fluid to keep your urine clear or pale yellow.  Avoid caffeine, tea, and carbonated beverages. They tend to irritate the bladder.  Urinate often. Avoid holding in urine for long periods of time.  Urinate before and after sex.  After a bowel movement, women should cleanse from front to back. Use each tissue only once.  Keep all follow-up visits as told by your health care provider. This is important. Contact a health care provider if:  Your symptoms do not get better after 2 days of treatment.  Your symptoms get worse.  You have a fever. Get help right away if:  You are unable to take your antibiotics or fluids.  You have shaking chills.  You vomit.  You have severe flank or back pain.  You have extreme weakness or fainting. This information is not intended to replace advice given to you by your health care provider. Make sure you discuss any questions you have with your health care provider. Document Released: 07/19/2005 Document Revised: 12/25/2015 Document Reviewed: 11/11/2014 Elsevier Interactive Patient Education  2017 [employer].

## 2016-11-03 ENCOUNTER — Encounter: Payer: Self-pay | Admitting: Gynecology

## 2016-11-03 ENCOUNTER — Ambulatory Visit (INDEPENDENT_AMBULATORY_CARE_PROVIDER_SITE_OTHER): Payer: 59 | Admitting: Gynecology

## 2016-11-03 VITALS — BP 118/74 | Ht 65.0 in | Wt 196.0 lb

## 2016-11-03 DIAGNOSIS — Z01419 Encounter for gynecological examination (general) (routine) without abnormal findings: Secondary | ICD-10-CM

## 2016-11-03 NOTE — Progress Notes (Signed)
    Kristina Leon May 28, 1986 [id number]        31 y.o.  G0P0000 for annual exam.    Past medical history,surgical history, problem list, medications, allergies, family history and social history were all reviewed and documented as reviewed in the EPIC chart.  ROS:  Performed with pertinent positives and negatives included in the history, assessment and plan.   Additional significant findings :  None   Exam: [name] assistant Vitals:   11/03/16 1035  BP: 118/74  Weight: 196 lb (88.9 kg)  Height: 5\' 5"  (1.651 m)   Body mass index is 32.62 kg/m.  General appearance:  Normal affect, orientation and appearance. Skin: Grossly normal HEENT: Without gross lesions.  No cervical or supraclavicular adenopathy. Thyroid normal.  Lungs:  Clear without wheezing, rales or rhonchi Cardiac: RR, without RMG Abdominal:  Soft, nontender, without masses, guarding, rebound, organomegaly or hernia Breasts:  Examined lying and sitting without masses, retractions, discharge or axillary adenopathy. Pelvic:  Ext, BUS, Vagina: Normal noting virginal status  Cervix: Normal  Uterus: Axial to anteverted, normal size, shape and contour, midline and mobile nontender   Adnexa: Without masses or tenderness    Anus and perineum: Normal   Rectovaginal: Normal sphincter tone without palpated masses or tenderness.    Assessment/Plan:  31 y.o. G0P0000 female for annual exam with regular menses, virginal status.   1. History of leiomyoma status post abdominal multiple myomectomy last year. Doing well with regular light menses. Exam shows uterus grossly normal in size. Continue to follow report any issues as far as menses. 2. Contraception not an issue as she remains virginal and plans to continue this. 3. Pap smear 10/2015. No Pap smear done today. No history of abnormal Pap smears. Plan repeat Pap smear at 3 year interval per current screening guidelines. 4. Breast health. SBE monthly reviewed. Plan Baseline  mammogram at age 13. 41. Health maintenance. Patient reports routine blood work done elsewhere. Follow up in one year, sooner as needed.   Anastasio Auerbach MD, 10:55 AM 11/03/2016

## 2016-11-03 NOTE — Patient Instructions (Signed)

## 2016-12-15 ENCOUNTER — Encounter: Payer: Self-pay | Admitting: Gynecology

## 2017-12-07 ENCOUNTER — Encounter: Payer: 59 | Admitting: Gynecology

## 2017-12-30 ENCOUNTER — Encounter: Payer: Self-pay | Admitting: Gynecology

## 2017-12-30 ENCOUNTER — Ambulatory Visit (INDEPENDENT_AMBULATORY_CARE_PROVIDER_SITE_OTHER): Payer: 59 | Admitting: Gynecology

## 2017-12-30 VITALS — BP 118/74 | Ht 64.0 in | Wt 202.0 lb

## 2017-12-30 DIAGNOSIS — Z1151 Encounter for screening for human papillomavirus (HPV): Secondary | ICD-10-CM

## 2017-12-30 DIAGNOSIS — Z01419 Encounter for gynecological examination (general) (routine) without abnormal findings: Secondary | ICD-10-CM

## 2017-12-30 NOTE — Addendum Note (Signed)
Addended by: Nelva Nay on: 12/30/2017 02:48 PM   Modules accepted: Orders

## 2017-12-30 NOTE — Patient Instructions (Signed)
Follow-up in 1 year for annual exam, sooner if any issues. 

## 2017-12-30 NOTE — Progress Notes (Signed)
    Kristina Leon 04-11-1986 [id number]        31 y.o.  G0P0000 for annual gynecologic exam.  Doing well without GYN complaints.  Past medical history,surgical history, problem list, medications, allergies, family history and social history were all reviewed and documented as reviewed in the EPIC chart.  ROS:  Performed with pertinent positives and negatives included in the history, assessment and plan.   Additional significant findings : None   Exam: [name] assistant Vitals:   12/30/17 1359  BP: 118/74  Weight: 202 lb (91.6 kg)  Height: 5\' 4"  (1.626 m)   Body mass index is 34.67 kg/m.  General appearance:  Normal affect, orientation and appearance. Skin: Grossly normal HEENT: Without gross lesions.  No cervical or supraclavicular adenopathy. Thyroid normal.  Lungs:  Clear without wheezing, rales or rhonchi Cardiac: RR, without RMG Abdominal:  Soft, nontender, without masses, guarding, rebound, organomegaly or hernia Breasts:  Examined lying and sitting without masses, retractions, discharge or axillary adenopathy. Pelvic:  Ext, BUS, Vagina: Normal with virginal status  Cervix: Normal.  Pap smear/HPV  Uterus: Anteverted, normal size, shape and contour, midline and mobile nontender   Adnexa: Without masses or tenderness    Anus and perineum: Normal   Rectovaginal: Normal sphincter tone without palpated masses or tenderness.    Assessment/Plan:  32 y.o. G0P0000 female for annual gynecologic exam with regular menses, remains virginal.  1. History of leiomyoma status post multiple myomectomy 2017.  Exam shows uterus to be normal size.  Menses continue regular monthly. 2. Pap smear 2017.  Pap smear/HPV today.  No history of abnormal Pap smears. 3. Breast health.  Breast exam normal today.  SBE monthly reviewed.  Plan baseline mammography approaching age 29. 21. Health maintenance.  No routine lab work done as patient does this elsewhere.  Follow-up 1 year, sooner as  needed.   Anastasio Auerbach MD, 2:24 PM 12/30/2017

## 2018-01-02 LAB — PAP IG AND HPV HIGH-RISK: HPV DNA High Risk: NOT DETECTED

## 2018-12-08 IMAGING — CT CT ABD-PELV W/O CM
2 of 3 series · 11 of 46 positions shown, 12 images · IV contrast (Iodine)
Comparison: None.

CLINICAL DATA: Bilateral flank pain for 4 days.  Nausea, vomiting.

EXAM:
CT ABDOMEN AND PELVIS WITHOUT CONTRAST
TECHNIQUE: Multidetector CT imaging of the abdomen and pelvis was performed
following the standard protocol without IV contrast.

[Series 201: routine, idose (2) · axial · 0.83mm/px · z∈[+168,+578]mm · 8 of 96 slices shown, 9 images]
[im 7/96  soft-tissue]
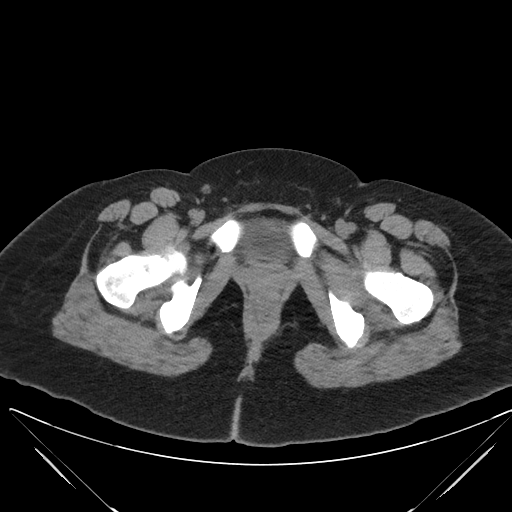
[im 7/96  bone]
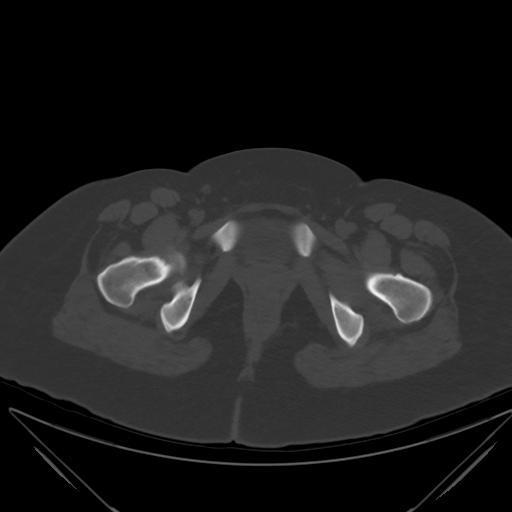
[im 19/96  soft-tissue]
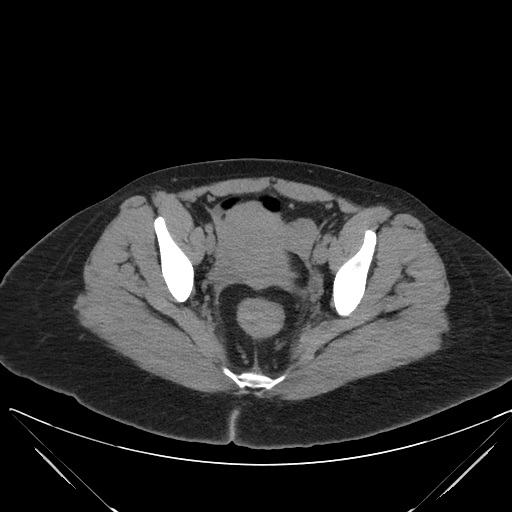
[im 31/96  soft-tissue]
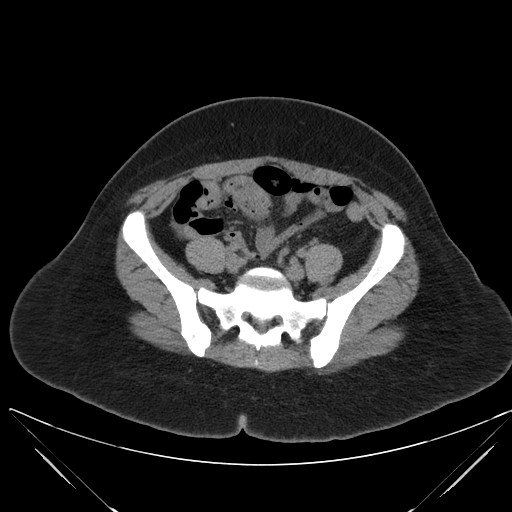
[im 43/96  soft-tissue]
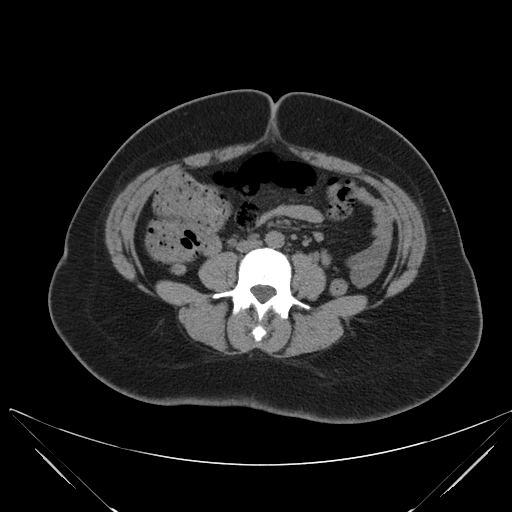
[im 53/96  soft-tissue]
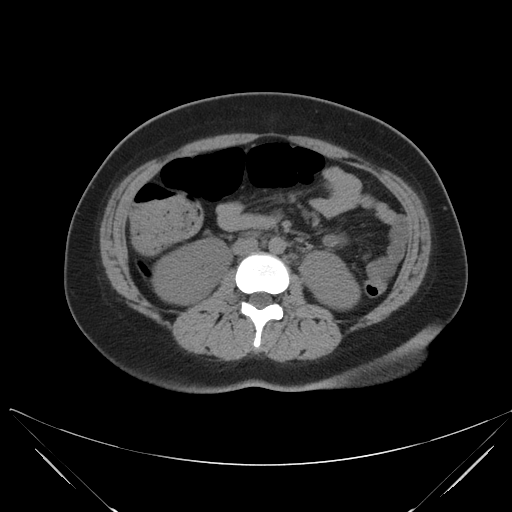
[im 65/96  soft-tissue]
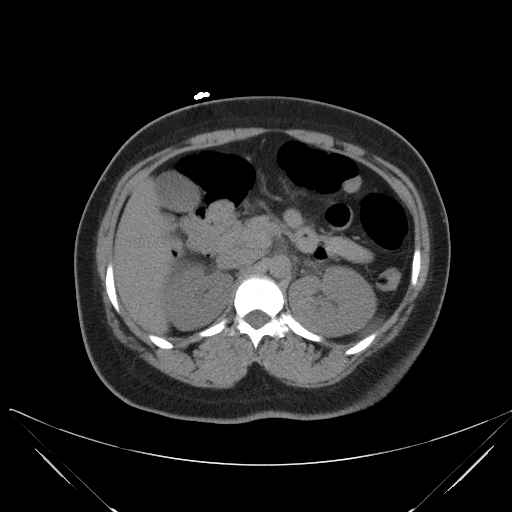
[im 77/96  soft-tissue]
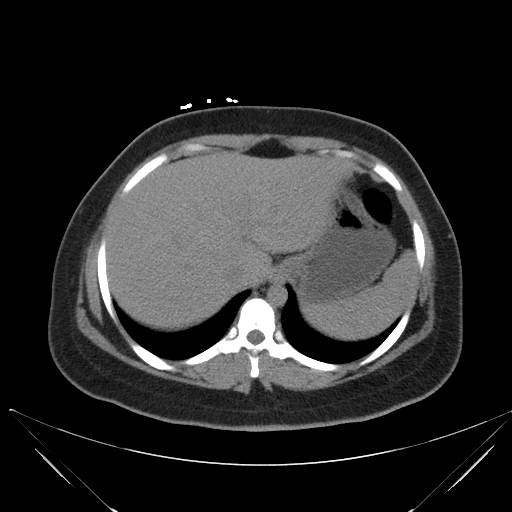
[im 89/96  soft-tissue]
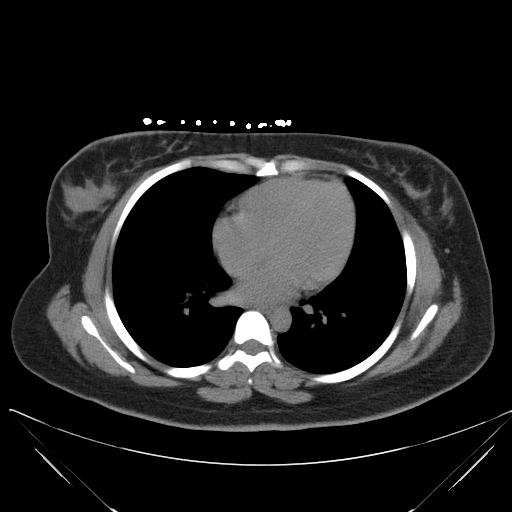

[Series 203: coronals, idose (2) · coronal · 0.45mm/px · 3 of 139 slices shown]
[im 47/139  soft-tissue]
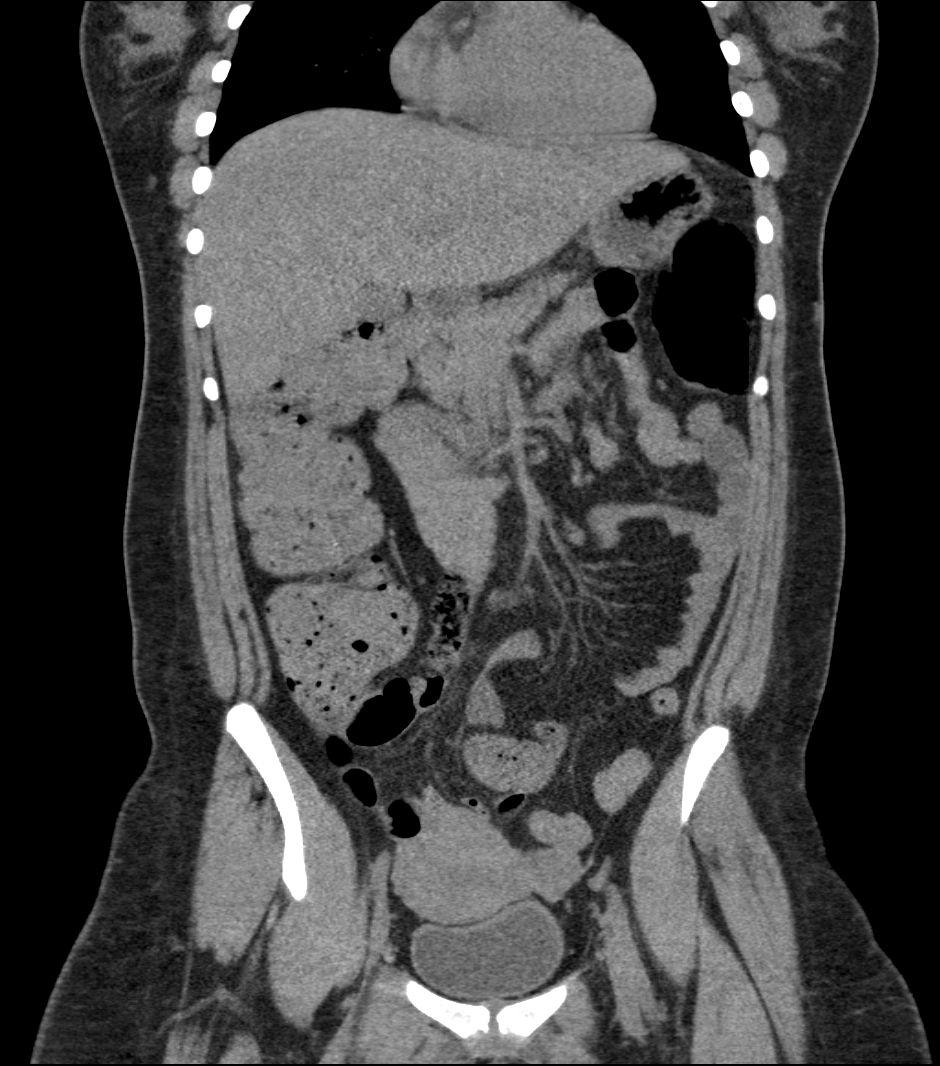
[im 62/139  soft-tissue]
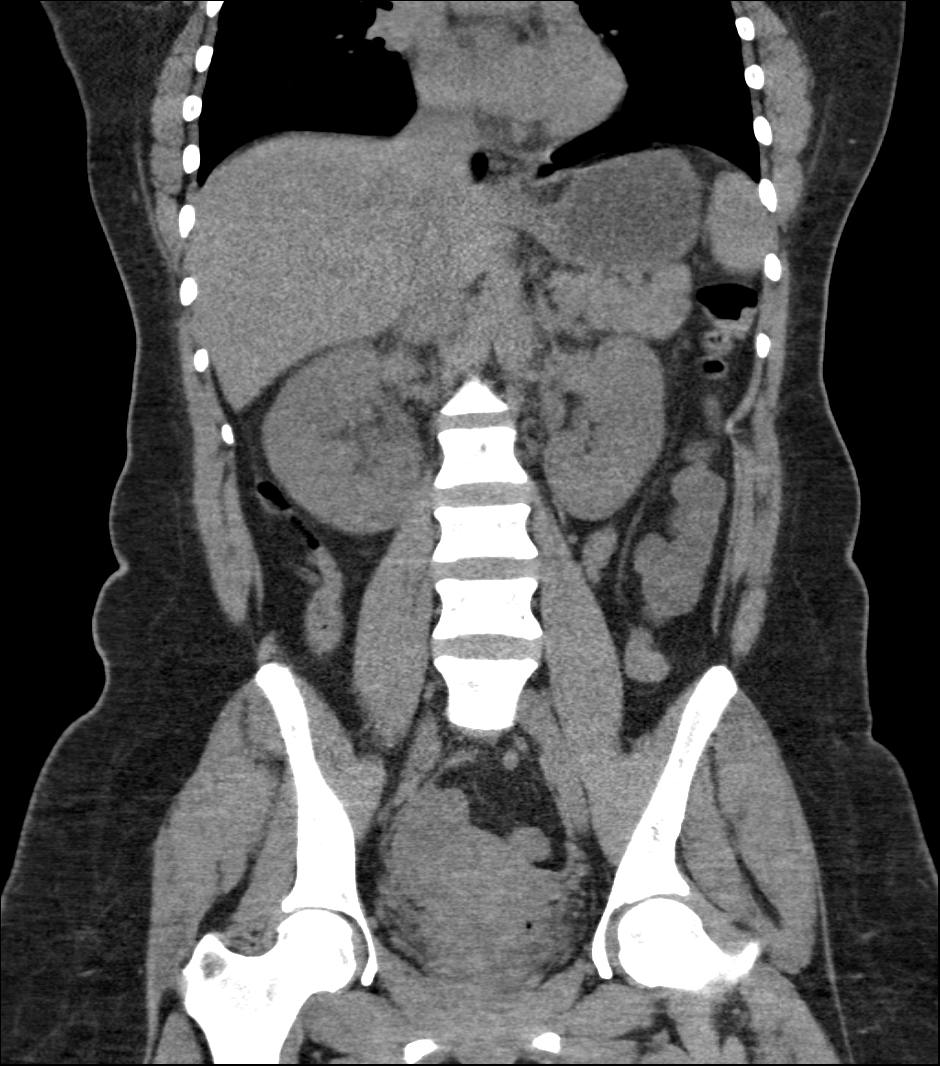
[im 77/139  soft-tissue]
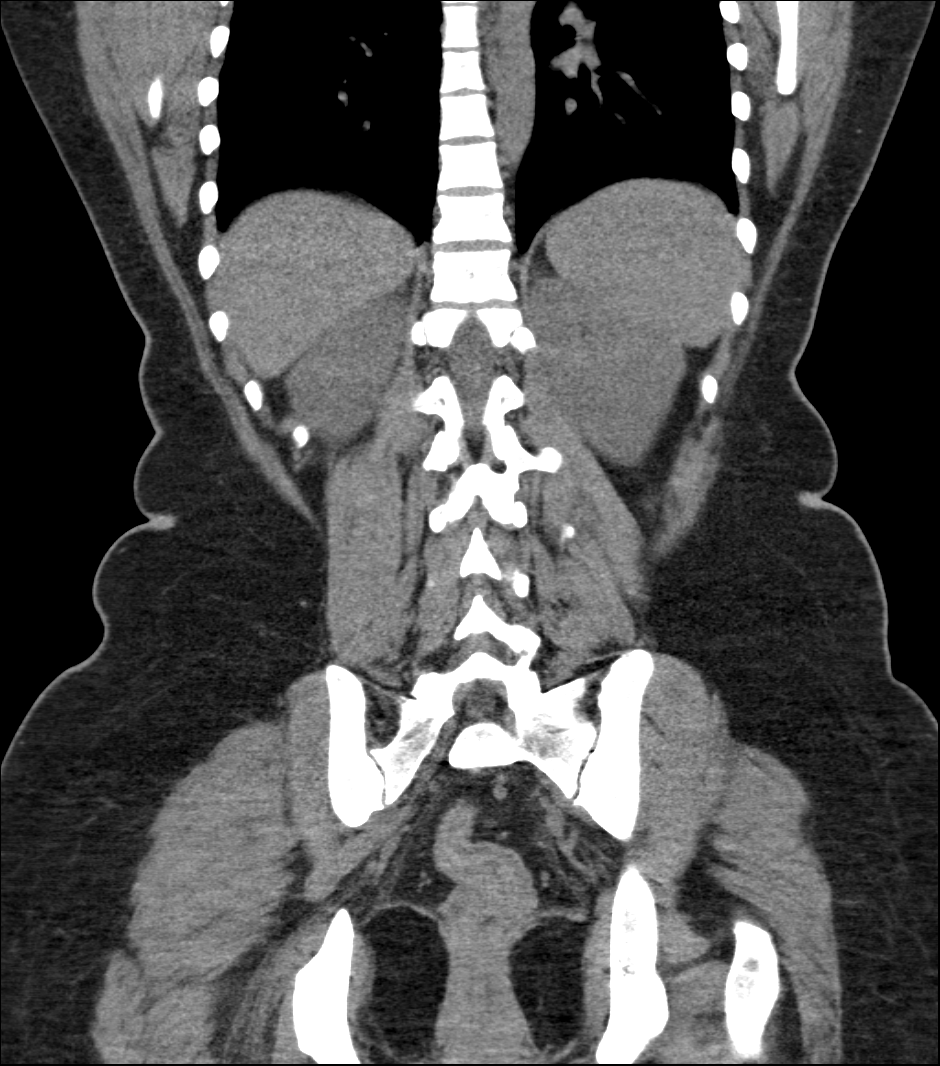

[11 of 46 positions shown; findings below may reference images not displayed]

FINDINGS: Lower chest: No acute abnormality.

Hepatobiliary: No focal liver abnormality is seen. No gallstones,
gallbladder wall thickening, or biliary dilatation.

Pancreas: Unremarkable. No pancreatic ductal dilatation or
surrounding inflammatory changes.

Spleen: Normal in size without focal abnormality.

Adrenals/Urinary Tract: No adrenal abnormality. No focal renal
abnormality. No stones or hydronephrosis. Urinary bladder is
unremarkable.

Stomach/Bowel: Normal appendix. Stomach, large and small bowel
grossly unremarkable.

Vascular/Lymphatic: No evidence of aneurysm or adenopathy. Shotty
retroperitoneal lymph nodes, none pathologically enlarged.

Reproductive: Uterus and adnexa unremarkable.  No mass.

Other: No free fluid or free air.

Musculoskeletal: No acute bony abnormality.
IMPRESSION: No acute findings.  No renal or ureteral stones.  No hydronephrosis.

## 2019-01-04 ENCOUNTER — Other Ambulatory Visit: Payer: Self-pay

## 2019-01-05 ENCOUNTER — Encounter: Payer: Self-pay | Admitting: Gynecology

## 2019-01-05 ENCOUNTER — Ambulatory Visit (INDEPENDENT_AMBULATORY_CARE_PROVIDER_SITE_OTHER): Payer: 59 | Admitting: Gynecology

## 2019-01-05 VITALS — BP 116/76 | Ht 65.0 in | Wt 220.0 lb

## 2019-01-05 DIAGNOSIS — Z13220 Encounter for screening for lipoid disorders: Secondary | ICD-10-CM | POA: Diagnosis not present

## 2019-01-05 DIAGNOSIS — Z01419 Encounter for gynecological examination (general) (routine) without abnormal findings: Secondary | ICD-10-CM

## 2019-01-05 NOTE — Patient Instructions (Signed)
Follow-up in 1 year for annual exam, sooner as needed. 

## 2019-01-05 NOTE — Progress Notes (Signed)
    Kristina Leon April 30, 1986 [id number]        33 y.o.  G0P0000 for annual gynecologic exam.  Notes her periods are little bit heavier but remain regular with no intermenstrual bleeding.  Past medical history,surgical history, problem list, medications, allergies, family history and social history were all reviewed and documented as reviewed in the EPIC chart.  ROS:  Performed with pertinent positives and negatives included in the history, assessment and plan.   Additional significant findings : None   Exam: [name] assistant Vitals:   01/05/19 0853  BP: 116/76  Weight: 220 lb (99.8 kg)  Height: 5\' 5"  (1.651 m)   Body mass index is 36.61 kg/m.  General appearance:  Normal affect, orientation and appearance. Skin: Grossly normal HEENT: Without gross lesions.  No cervical or supraclavicular adenopathy. Thyroid normal.  Lungs:  Clear without wheezing, rales or rhonchi Cardiac: RR, without RMG Abdominal:  Soft, nontender, without masses, guarding, rebound, organomegaly or hernia Breasts:  Examined lying and sitting without masses, retractions, discharge or axillary adenopathy. Pelvic:  Ext, BUS, Vagina: Normal with virginal status  Cervix: Normal  Uterus: Anteverted, normal size, shape and contour, midline and mobile nontender   Adnexa: Without masses or tenderness    Anus and perineum: Normal   Rectovaginal: Normal sphincter tone without palpated masses or tenderness.    Assessment/Plan:  33 y.o. G0P0000 female for annual gynecologic exam.  With regular menses, virginal status  1. History of leiomyoma with uterus to the umbilicus 0867 status post myomectomy.  Uterus palpates normal size today.  Patient notes her menses are little heavier.  Discussed possibly regrowth of smaller myomas.  Since for ultrasound discussed but reviewed if indeed we see small myomas would not do anything different given the overall normal uterine size.  We both agree to monitor for now and follow-up  in 1 year for reexamination, sooner if bleeding gets heavier or other symptoms present.  Will check baseline CBC today. 2. Pap smear/HPV 2019.  No Pap smear done today.  No history of abnormal Pap smears. 3. Breast health.  Breast exam normal today.  SBE monthly reviewed.  Plan screening mammography at age 58. 37. Health maintenance.  Requests baseline labs.  CBC, CMP and lipid profile ordered.  Follow-up 1 year, sooner as needed.   Anastasio Auerbach MD, 9:14 AM 01/05/2019

## 2019-01-06 LAB — COMPREHENSIVE METABOLIC PANEL
AG Ratio: 1.1 (calc) (ref 1.0–2.5)
ALT: 13 U/L (ref 6–29)
AST: 14 U/L (ref 10–30)
Albumin: 3.9 g/dL (ref 3.6–5.1)
Alkaline phosphatase (APISO): 80 U/L (ref 31–125)
BUN: 10 mg/dL (ref 7–25)
CO2: 25 mmol/L (ref 20–32)
Calcium: 9.2 mg/dL (ref 8.6–10.2)
Chloride: 105 mmol/L (ref 98–110)
Creat: 0.58 mg/dL (ref 0.50–1.10)
Globulin: 3.4 g/dL (calc) (ref 1.9–3.7)
Glucose, Bld: 95 mg/dL (ref 65–99)
Potassium: 4.3 mmol/L (ref 3.5–5.3)
Sodium: 140 mmol/L (ref 135–146)
Total Bilirubin: 0.2 mg/dL (ref 0.2–1.2)
Total Protein: 7.3 g/dL (ref 6.1–8.1)

## 2019-01-06 LAB — CBC WITH DIFFERENTIAL/PLATELET
Absolute Monocytes: 688 cells/uL (ref 200–950)
Basophils Absolute: 59 cells/uL (ref 0–200)
Basophils Relative: 0.8 %
Eosinophils Absolute: 281 cells/uL (ref 15–500)
Eosinophils Relative: 3.8 %
HCT: 35.5 % (ref 35.0–45.0)
Hemoglobin: 11.1 g/dL — ABNORMAL LOW (ref 11.7–15.5)
Lymphs Abs: 2686 cells/uL (ref 850–3900)
MCH: 23.6 pg — ABNORMAL LOW (ref 27.0–33.0)
MCHC: 31.3 g/dL — ABNORMAL LOW (ref 32.0–36.0)
MCV: 75.4 fL — ABNORMAL LOW (ref 80.0–100.0)
MPV: 8.4 fL (ref 7.5–12.5)
Monocytes Relative: 9.3 %
Neutro Abs: 3685 cells/uL (ref 1500–7800)
Neutrophils Relative %: 49.8 %
Platelets: 491 10*3/uL — ABNORMAL HIGH (ref 140–400)
RBC: 4.71 10*6/uL (ref 3.80–5.10)
RDW: 16.1 % — ABNORMAL HIGH (ref 11.0–15.0)
Total Lymphocyte: 36.3 %
WBC: 7.4 10*3/uL (ref 3.8–10.8)

## 2019-01-06 LAB — LIPID PANEL
Cholesterol: 163 mg/dL (ref <200)
HDL: 44 mg/dL — ABNORMAL LOW (ref >=50)
LDL Cholesterol (Calc): 101 mg/dL (calc) — ABNORMAL HIGH
Non-HDL Cholesterol (Calc): 119 mg/dL (calc) (ref <130)
Total CHOL/HDL Ratio: 3.7 (calc) (ref <5.0)
Triglycerides: 86 mg/dL (ref <150)

## 2019-01-08 ENCOUNTER — Encounter: Payer: Self-pay | Admitting: Gynecology

## 2019-01-22 ENCOUNTER — Telehealth: Payer: Self-pay

## 2019-01-22 NOTE — Telephone Encounter (Signed)
My Chart email was unread. Per DPR access note on file I read her Dr. Dorette Grate result note: "Your lab results look good with the exception of a mild anemia. I suspect this is due to periods being a little heavier. I would recommend taking an iron supplement daily such as a multivitamin with iron. "

## 2019-04-23 ENCOUNTER — Encounter: Payer: Self-pay | Admitting: Gynecology

## 2019-11-15 ENCOUNTER — Other Ambulatory Visit: Payer: Self-pay

## 2019-11-15 ENCOUNTER — Ambulatory Visit (INDEPENDENT_AMBULATORY_CARE_PROVIDER_SITE_OTHER): Payer: 59 | Admitting: Family Medicine

## 2019-11-15 ENCOUNTER — Encounter: Payer: Self-pay | Admitting: Family Medicine

## 2019-11-15 VITALS — BP 118/70 | HR 90 | Ht 65.5 in | Wt 202.6 lb

## 2019-11-15 DIAGNOSIS — M238X2 Other internal derangements of left knee: Secondary | ICD-10-CM | POA: Diagnosis not present

## 2019-11-15 DIAGNOSIS — M25562 Pain in left knee: Secondary | ICD-10-CM | POA: Diagnosis not present

## 2019-11-15 DIAGNOSIS — M25561 Pain in right knee: Secondary | ICD-10-CM | POA: Diagnosis not present

## 2019-11-15 DIAGNOSIS — M238X1 Other internal derangements of right knee: Secondary | ICD-10-CM

## 2019-11-15 NOTE — Progress Notes (Signed)
   Subjective:    Patient ID: Kristina Leon, female    DOB: 1985/08/11, 34 y.o.   MRN: [id number]  HPI Chief Complaint  Patient presents with  . knee pain    knee pain- saw dr last year and was referred to PT but didnt' help.    She is new to the practice and here to establish care.  Previous medical care: moved here from [place] in 2012.  Last PCP moved away in 2016   She sees an OB/GYN.  Labs done 6 months ago.   Complains of a one year history of bilateral anterior knee pain but worse on her right. No known injury or history of knee surgery.  States her biggest concern is the sound of her knees "cracking when going up stairs".  Pain is mild and occurs when she walking up stairs. No pain at rest, when sleeping or walking flat surfaces. No edema, erythema.  No numbness, tingling or weakness.   Anemia - has been mild. Is not currently taking iron.   No fever, chills, dizziness, chest pain, palpitations, shortness of breath, abdominal pain, N/V/D. No other arthralgias.   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   Reviewed allergies, medications, past medical, surgical, family, and social history.   Review of Systems Pertinent positives and negatives in the history of present illness.     Objective:   Physical Exam Constitutional:      Appearance: Normal appearance.  Musculoskeletal:     Right knee: Crepitus present. No swelling or erythema. Normal range of motion. No tenderness. Normal patellar mobility.     Left knee: Crepitus present. No swelling or erythema. Normal range of motion. No tenderness. Normal patellar mobility.     Comments: No joint line tenderness or laxity.   Neurological:     Mental Status: She is alert.    BP 118/70   Pulse 90   Ht 5' 5.5" (1.664 m)   Wt 202 lb 9.6 oz (91.9 kg)   BMI 33.20 kg/m       Assessment & Plan:  Acute pain of both knees  Crepitus of both knee joints  Knee  exams normal except for crepitus bilaterally.  Recommend supportive care and quad strengthening exercises demonstrated.  She will try topical analgesic as needed.  Follow up if worsening.

## 2019-11-15 NOTE — Patient Instructions (Addendum)
Try doing the quad strengthening exercises that I demonstrated for you. Do 3 sets of 10 with both legs daily.   You may want to try over the counter topical pain medication such as Salon Pas with Lidocaine, Biofreeze, or Voltaren gel.   Follow up if needed.    Patellofemoral Pain Syndrome  Patellofemoral pain syndrome is a condition in which the tissue (cartilage) on the underside of the kneecap (patella) softens or breaks down. This causes pain in the front of the knee. The condition is also called runner's knee or chondromalacia patella. Patellofemoral pain syndrome is most common in young adults who are active in sports. The knee is the largest joint in the body. The patella covers the front of the knee and is attached to muscles above and below the knee. The underside of the patella is covered with a smooth type of cartilage (synovium). The smooth surface helps the patella to glide easily when you move your knee. Patellofemoral pain syndrome causes swelling in the joint linings and bone surfaces in the knee. What are the causes? This condition may be caused by:  Overuse of the knee.  Poor alignment of your knee joints.  Weak leg muscles.  A direct blow to your kneecap. What increases the risk? You are more likely to develop this condition if:  You do a lot of activities that can wear down your kneecap. These include: ? Running. ? Squatting. ? Climbing stairs.  You start a new physical activity or exercise program.  You wear shoes that do not fit well.  You do not have good leg strength.  You are overweight. What are the signs or symptoms? The main symptom of this condition is knee pain. This may feel like a dull, aching pain underneath your patella, in the front of your knee. There may be a popping or cracking sound when you move your knee. Pain may get worse with:  Exercise.  Climbing stairs.  Running.  Jumping.  Squatting.  Kneeling.  Sitting for a long  time.  Moving or pushing on your patella. How is this diagnosed? This condition may be diagnosed based on:  Your symptoms and medical history. You may be asked about your recent physical activities and which ones cause knee pain.  A physical exam. This may include: ? Moving your patella back and forth. ? Checking your range of knee motion. ? Having you squat or jump to see if you have pain. ? Checking the strength of your leg muscles.  Imaging tests to confirm the diagnosis. These may include an MRI of your knee. How is this treated? This condition may be treated at home with rest, ice, compression, and elevation (RICE).  Other treatments may include:  Nonsteroidal anti-inflammatory drugs (NSAIDs).  Physical therapy to stretch and strengthen your leg muscles.  Shoe inserts (orthotics) to take stress off your knee.  A knee brace or knee support.  Adhesive tapes to the skin.  Surgery to remove damaged cartilage or move the patella to a better position. This is rare. Follow these instructions at home: If you have a shoe or brace:  Wear the shoe or brace as told by your health care provider. Remove it only as told by your health care provider.  Loosen the shoe or brace if your toes tingle, become numb, or turn cold and blue.  Keep the shoe or brace clean.  If the shoe or brace is not waterproof: ? Do not let it get wet. ? Cover  it with a watertight covering when you take a bath or a shower. Managing pain, stiffness, and swelling  If directed, put ice on the painful area. ? If you have a removable shoe or brace, remove it as told by your health care provider. ? Put ice in a plastic bag. ? Place a towel between your skin and the bag. ? Leave the ice on for 20 minutes, 2-3 times a day.  Move your toes often to avoid stiffness and to lessen swelling.  Rest your knee: ? Avoid activities that cause knee pain. ? When sitting or lying down, raise (elevate) the injured area  above the level of your heart, whenever possible. General instructions  Take over-the-counter and prescription medicines only as told by your health care provider.  Use splints, braces, knee supports, or walking aids as directed by your health care provider.  Perform stretching and strengthening exercises as told by your health care provider or physical therapist.  Do not use any products that contain nicotine or tobacco, such as cigarettes and e-cigarettes. These can delay healing. If you need help quitting, ask your health care provider.  Return to your normal activities as told by your health care provider. Ask your health care provider what activities are safe for you.  Keep all follow-up visits as told by your health care provider. This is important. Contact a health care provider if:  Your symptoms get worse.  You are not improving with home care. Summary  Patellofemoral pain syndrome is a condition in which the tissue (cartilage) on the underside of the kneecap (patella) softens or breaks down.  This condition causes swelling in the joint linings and bone surfaces in the knee. This leads to pain in the front of the knee.  This condition may be treated at home with rest, ice, compression, and elevation (RICE).  Use splints, braces, knee supports, or walking aids as directed by your health care provider. This information is not intended to replace advice given to you by your health care provider. Make sure you discuss any questions you have with your health care provider. Document Revised: 08/29/2017 Document Reviewed: 08/29/2017 Elsevier Patient Education  2020 [employer].

## 2019-12-14 ENCOUNTER — Encounter: Payer: 59 | Admitting: Family Medicine

## 2020-01-04 ENCOUNTER — Other Ambulatory Visit: Payer: Self-pay

## 2020-01-06 NOTE — Progress Notes (Deleted)
   Subjective:    Patient ID: Kristina Leon, female    DOB: 1986/04/28, 34 y.o.   MRN: [id number]  HPI No chief complaint on file.  She is here for a complete physical exam. Previous medical care: Last CPE:  Other providers:  Past medical history: Surgeries:  Family history: Mental Health History:  Social history: Lives with ***, works as ***, *** Smoking, drinking alcohol, drug use  Diet: *** Excerise: ***  Immunizations:  Health maintenance:  Mammogram: Colonoscopy: Last Gynecological Exam: Last Menstrual cycle: Pregnancies:  Last Dental Exam: Last Eye Exam:  Wears seatbelt always, uses sunscreen, smoke detectors in home and functioning, does not text while driving and feels safe in home environment.   Reviewed allergies, medications, past medical, surgical, family, and social history.   Review of Systems Review of Systems Constitutional: -fever, -chills, -sweats, -unexpected weight change,-fatigue ENT: -runny nose, -ear pain, -sore throat Cardiology:  -chest pain, -palpitations, -edema Respiratory: -cough, -shortness of breath, -wheezing Gastroenterology: -abdominal pain, -nausea, -vomiting, -diarrhea, -constipation  Hematology: -bleeding or bruising problems Musculoskeletal: -arthralgias, -myalgias, -joint swelling, -back pain Ophthalmology: -vision changes Urology: -dysuria, -difficulty urinating, -hematuria, -urinary frequency, -urgency Neurology: -headache, -weakness, -tingling, -numbness       Objective:   Physical Exam There were no vitals taken for this visit.  General Appearance:    Alert, cooperative, no distress, appears stated age  Head:    Normocephalic, without obvious abnormality, atraumatic  Eyes:    PERRL, conjunctiva/corneas clear, EOM's intact, fundi    benign  Ears:    Normal TM's and external ear canals  Nose:   Nares normal, mucosa normal, no drainage or sinus   tenderness  Throat:   Lips, mucosa, and tongue normal; teeth and  gums normal  Neck:   Supple, no lymphadenopathy;  thyroid:  no   enlargement/tenderness/nodules; no carotid   bruit or JVD  Back:    Spine nontender, no curvature, ROM normal, no CVA     tenderness  Lungs:     Clear to auscultation bilaterally without wheezes, rales or     ronchi; respirations unlabored  Chest Wall:    No tenderness or deformity   Heart:    Regular rate and rhythm, S1 and S2 normal, no murmur, rub   or gallop  Breast Exam:    No tenderness, masses, or nipple discharge or inversion.      No axillary lymphadenopathy  Abdomen:     Soft, non-tender, nondistended, normoactive bowel sounds,    no masses, no hepatosplenomegaly  Genitalia:    Normal external genitalia without lesions.  BUS and vagina normal; cervix without lesions, or cervical motion tenderness. No abnormal vaginal discharge.  Uterus and adnexa not enlarged, nontender, no masses.  Pap performed  Rectal:    Not performed due to age<40 and no related complaints  Extremities:   No clubbing, cyanosis or edema  Pulses:   2+ and symmetric all extremities  Skin:   Skin color, texture, turgor normal, no rashes or lesions  Lymph nodes:   Cervical, supraclavicular, and axillary nodes normal  Neurologic:   CNII-XII intact, normal strength, sensation and gait; reflexes 2+ and symmetric throughout          Psych:   Normal mood, affect, hygiene and grooming.           Assessment & Plan:  Routine general medical examination at a health care facility

## 2020-01-07 ENCOUNTER — Other Ambulatory Visit: Payer: Self-pay

## 2020-01-07 ENCOUNTER — Ambulatory Visit (INDEPENDENT_AMBULATORY_CARE_PROVIDER_SITE_OTHER): Payer: 59 | Admitting: Obstetrics and Gynecology

## 2020-01-07 ENCOUNTER — Encounter: Payer: 59 | Admitting: Family Medicine

## 2020-01-07 ENCOUNTER — Encounter: Payer: Self-pay | Admitting: Obstetrics and Gynecology

## 2020-01-07 VITALS — BP 116/76 | Ht 65.0 in | Wt 199.0 lb

## 2020-01-07 DIAGNOSIS — Z862 Personal history of diseases of the blood and blood-forming organs and certain disorders involving the immune mechanism: Secondary | ICD-10-CM | POA: Diagnosis not present

## 2020-01-07 DIAGNOSIS — N920 Excessive and frequent menstruation with regular cycle: Secondary | ICD-10-CM

## 2020-01-07 DIAGNOSIS — D259 Leiomyoma of uterus, unspecified: Secondary | ICD-10-CM | POA: Diagnosis not present

## 2020-01-07 DIAGNOSIS — Z01419 Encounter for gynecological examination (general) (routine) without abnormal findings: Secondary | ICD-10-CM | POA: Diagnosis not present

## 2020-01-07 LAB — CBC
HCT: 31.3 % — ABNORMAL LOW (ref 35.0–45.0)
Hemoglobin: 9 g/dL — ABNORMAL LOW (ref 11.7–15.5)
MCH: 20.2 pg — ABNORMAL LOW (ref 27.0–33.0)
MCHC: 28.8 g/dL — ABNORMAL LOW (ref 32.0–36.0)
MCV: 70.3 fL — ABNORMAL LOW (ref 80.0–100.0)
MPV: 8.4 fL (ref 7.5–12.5)
Platelets: 415 10*3/uL — ABNORMAL HIGH (ref 140–400)
RBC: 4.45 10*6/uL (ref 3.80–5.10)
RDW: 16.2 % — ABNORMAL HIGH (ref 11.0–15.0)
WBC: 7.8 10*3/uL (ref 3.8–10.8)

## 2020-01-07 NOTE — Progress Notes (Signed)
   Kristina Leon 12-10-1985 [id number]  SUBJECTIVE:  34 y.o. G0P0000 female for annual routine gynecologic exam. Mother passed away in 08/12/23.  She is occasionally some months having heavy periods requiring her to wear a 'diaper.'  Periods are regular and monthly.   No current outpatient medications on file.   No current facility-administered medications for this visit.   Allergies: Pork-derived products  Patient's last menstrual period was 12/05/2019.  Past medical history,surgical history, problem list, medications, allergies, family history and social history were all reviewed and documented as reviewed in the EPIC chart.  ROS:  Feeling well. No dyspnea or chest pain on exertion.  No abdominal pain, change in bowel habits, black or bloody stools.  No urinary tract symptoms. GYN ROS: + abnormal bleeding, no pelvic pain or discharge, no breast pain or new or enlarging lumps on self exam. No neurological complaints.   OBJECTIVE:  BP 116/76   Ht 5\' 5"  (1.651 m)   Wt 199 lb (90.3 kg)   LMP 12/05/2019   BMI 33.12 kg/m  The patient appears well, alert, oriented x 3, in no distress. ENT normal.  Neck supple. No cervical or supraclavicular adenopathy or thyromegaly.  Lungs are clear, good air entry, no wheezes, rhonchi or rales. S1 and S2 normal, no murmurs, regular rate and rhythm.  Abdomen soft without tenderness, guarding, mass or organomegaly.  Neurological is normal, no focal findings.  BREAST EXAM: breasts appear normal, no suspicious masses, no skin or nipple changes or axillary nodes  PELVIC EXAM: VULVA: normal appearing vulva with no masses, tenderness or lesions, VAGINA: Pediatric speculum used, normal appearing vagina with normal color and discharge, no lesions, CERVIX: normal appearing cervix without discharge or lesions, UTERUS: uterus slightly enlarged 10 weeks size, overall normal shape, consistency and nontender, ADNEXA: normal adnexa in size, nontender and no  masses  Chaperone: [name] present during the examination  ASSESSMENT:  34 y.o. G0P0000 here for annual gynecologic exam  PLAN:   1.  History of leiomyoma with enlarged uterus.  Prior myomectomy.  Uterus just slightly enlarged. Describes heavy periods.  Noted to be mildly anemic last year and she sometimes remembers to take iron supplements, encouraged her to take this regularly.  We will check a CBC today to follow-up the anemia.  We discussed that she could try hormonal contraception to help regulate her menstrual period and she would like to think about it and let us know. 2. Pap smear/HPV 11/2017. No history of abnormal Pap smears.  Next Pap smear due 2024 following the current guidelines recommending the 5 year co testing interval. 3. Contraception.   Virginal status.  Declines need for contraception at this time. 4. Breast exam normal.  Courage breast self-awareness.  Plan mammograms to start at age 63. 60. Health maintenance.  Recommend checking lipids and CMP every 2 to 3 years, just had this done last year and other than slightly low HDL, all was fine.  She is can on healthy diet to achieve some weight loss and try and exercise more which will increase her HDL.  Will check CBC as above to follow-up on anemia.  Return annually or sooner, prn.  Joseph Pierini MD 01/07/20

## 2020-01-10 ENCOUNTER — Encounter: Payer: Self-pay | Admitting: Family Medicine

## 2020-01-12 ENCOUNTER — Encounter (HOSPITAL_COMMUNITY): Payer: Self-pay

## 2020-01-12 ENCOUNTER — Emergency Department (HOSPITAL_COMMUNITY): Payer: 59

## 2020-01-12 ENCOUNTER — Other Ambulatory Visit: Payer: Self-pay

## 2020-01-12 ENCOUNTER — Emergency Department (HOSPITAL_COMMUNITY)
Admission: EM | Admit: 2020-01-12 | Discharge: 2020-01-12 | Disposition: A | Discharge status: Home / Self Care | Payer: 59 | Attending: Emergency Medicine | Admitting: Emergency Medicine

## 2020-01-12 DIAGNOSIS — R1013 Epigastric pain: Secondary | ICD-10-CM | POA: Diagnosis present

## 2020-01-12 DIAGNOSIS — K219 Gastro-esophageal reflux disease without esophagitis: Secondary | ICD-10-CM | POA: Diagnosis not present

## 2020-01-12 LAB — BASIC METABOLIC PANEL
Anion gap: 11 (ref 5–15)
BUN: 13 mg/dL (ref 6–20)
CO2: 22 mmol/L (ref 22–32)
Calcium: 8.8 mg/dL — ABNORMAL LOW (ref 8.9–10.3)
Chloride: 103 mmol/L (ref 98–111)
Creatinine, Ser: 0.68 mg/dL (ref 0.44–1.00)
GFR calc Af Amer: >60 mL/min (ref >60)
GFR calc non Af Amer: >60 mL/min (ref >60)
Glucose, Bld: 112 mg/dL — ABNORMAL HIGH (ref 70–99)
Potassium: 3.1 mmol/L — ABNORMAL LOW (ref 3.5–5.1)
Sodium: 136 mmol/L (ref 135–145)

## 2020-01-12 LAB — CBC
HCT: 30.5 % — ABNORMAL LOW (ref 36.0–46.0)
Hemoglobin: 8.7 g/dL — ABNORMAL LOW (ref 12.0–15.0)
MCH: 20.1 pg — ABNORMAL LOW (ref 26.0–34.0)
MCHC: 28.5 g/dL — ABNORMAL LOW (ref 30.0–36.0)
MCV: 70.6 fL — ABNORMAL LOW (ref 80.0–100.0)
Platelets: 461 10*3/uL — ABNORMAL HIGH (ref 150–400)
RBC: 4.32 MIL/uL (ref 3.87–5.11)
RDW: 17.1 % — ABNORMAL HIGH (ref 11.5–15.5)
WBC: 9.4 10*3/uL (ref 4.0–10.5)
nRBC: 0 % (ref 0.0–0.2)

## 2020-01-12 LAB — TROPONIN I (HIGH SENSITIVITY)
Troponin I (High Sensitivity): <2 ng/L (ref <18)
Troponin I (High Sensitivity): <2 ng/L (ref <18)

## 2020-01-12 LAB — I-STAT BETA HCG BLOOD, ED (MC, WL, AP ONLY): I-stat hCG, quantitative: <5 m[IU]/mL (ref <5)

## 2020-01-12 MED ORDER — SODIUM CHLORIDE 0.9% FLUSH: 3.0000 mL | Freq: Once | INTRAVENOUS | Status: DC

## 2020-01-12 MED ORDER — LIDOCAINE VISCOUS HCL 2 % MT SOLN
15.0000 mL | Freq: Once | OROMUCOSAL | Status: AC
  Administered 2020-01-12: 15 mL via ORAL
  Filled 2020-01-12: qty 15

## 2020-01-12 MED ORDER — ALUM & MAG HYDROXIDE-SIMETH 200-200-20 MG/5ML PO SUSP
30.0000 mL | Freq: Once | ORAL | Status: AC
  Administered 2020-01-12: 30 mL via ORAL
  Filled 2020-01-12: qty 30

## 2020-01-12 NOTE — ED Triage Notes (Addendum)
Patient arrived by [facility] following burning in epigastric and throat pain. Patient states that all the symptoms started after eating lamb this am. Patient vomited x 2 prior to arrival. -patient crying on assessment. Hyperventilating. Patient will not let EMT lift or reposition clothing for EKG.

## 2020-01-12 NOTE — Discharge Instructions (Signed)
Your work-up today was overall reassuring.  Your lab work did show slightly low potassium, please make sure to eat high potassium foods and follow-up with your primary care doctor about this.  Low potassium can sometimes cause arrhythmias of the heart so it is important to eat lots of potassium rich foods.  I have provided a handout with examples of this.  Your symptoms also could be likely to gastric reflux, you may take over-the-counter omeprazole (Prilosec), pantoprazole (Protonix), esomeprazole (Nexium) or any other proton pump inhibitor for gastric reflux.  Your blood work also showed anemia which you are aware of.  Return to the ER if your symptoms worsen.  Please make sure to follow-up with your primary care doctor.

## 2020-01-12 NOTE — ED Notes (Addendum)
Attempted EKG in triage and was not able to successfully capture due to restrictive clothing and the patient being in an excited state.

## 2020-01-12 NOTE — ED Provider Notes (Signed)
[facility] DEPARTMENT Provider Note   CSN: [id number] Arrival date & time: 01/12/20  1257     History No chief complaint on file.   Kristina Leon is a 34 y.o. female.  HPI 34 year old female with a history of anemia presents to the ER with epigastric and throat pain.  Patient states that she ate some lamb and then approximately 20 minutes later ate some ice cream and started to feel burning in her throat and chest.  She reports feeling like something is stuck in her throat.  She felt like she was having difficulty swallowing so she came to the ER.  Per triage notes, the patient was hyperventilating and tearful, not cooperative for the EKG.  She has had no nausea or vomiting or drooling.  No abdominal pain.  Endorses some chest pain which has now improved.  Denies any fevers, chills, back pain, palpitations, dizziness, syncope.  She denies a history of gastric reflux or episodes like this before.  She is not aware of an allergy to lamb or ice cream.    Past Medical History:  Diagnosis Date  . Headache    Migraines  . Iron deficiency anemia   . Kidney stone    2009    Patient Active Problem List   Diagnosis Date Noted  . Acute kidney injury ([place]) 10/26/2016    Past Surgical History:  Procedure Laterality Date  . BREAST SURGERY Right    cyst removed  . CYSTECTOMY     BREAST  . MYOMECTOMY N/A 01/06/2016   Procedure: MYOMECTOMY-abdominal;  Surgeon: Anastasio Auerbach, MD;  Location: [facility];  Service: Gynecology;  Laterality: N/A;     OB History    Gravida  0   Para  0   Term  0   Preterm  0   AB  0   Living  0     SAB  0   TAB  0   Ectopic  0   Multiple  0   Live Births              Family History  Problem Relation Age of Onset  . Anemia Mother   . Anemia Sister   . Breast cancer Maternal Aunt 39    Social History   Tobacco Use  . Smoking status: Never Smoker  . Smokeless tobacco: Never Used  Vaping Use  .  Vaping Use: Never used  Substance Use Topics  . Alcohol use: No    Alcohol/week: 0.0 standard drinks  . Drug use: No    Home Medications Prior to Admission medications   Not on File    Allergies    Pork-derived products  Review of Systems   Review of Systems  Constitutional: Negative for appetite change, chills and fever.  HENT: Positive for trouble swallowing. Negative for congestion, drooling, ear pain, facial swelling, sore throat and voice change.   Eyes: Negative for pain and visual disturbance.  Respiratory: Positive for shortness of breath. Negative for cough.   Cardiovascular: Positive for chest pain. Negative for palpitations.  Gastrointestinal: Negative for abdominal pain and vomiting.  Genitourinary: Negative for dysuria and hematuria.  Musculoskeletal: Negative for arthralgias and back pain.  Skin: Negative for color change and rash.  Neurological: Negative for seizures and syncope.  All other systems reviewed and are negative.   Physical Exam Updated Vital Signs BP 136/90   Pulse 85   Temp 97.7 F (36.5 C) (Oral)   Resp 16  SpO2 100%   Physical Exam Vitals and nursing note reviewed.  Constitutional:      General: She is not in acute distress.    Appearance: Normal appearance. She is well-developed. She is not ill-appearing or diaphoretic.  HENT:     Head: Normocephalic and atraumatic.     Right Ear: Tympanic membrane normal.     Nose: Nose normal.     Mouth/Throat:     Mouth: Mucous membranes are moist.     Pharynx: Oropharynx is clear. No posterior oropharyngeal erythema.     Comments: Oropharynx clear, uvula midline, no tonsillar exudates/erythema.  Tongue normal size.  No sublingual swelling. Eyes:     Extraocular Movements: Extraocular movements intact.     Conjunctiva/sclera: Conjunctivae normal.     Pupils: Pupils are equal, round, and reactive to light.  Cardiovascular:     Rate and Rhythm: Normal rate and regular rhythm.     Pulses:  Normal pulses.     Heart sounds: Normal heart sounds. No murmur heard.   Pulmonary:     Effort: Pulmonary effort is normal. No respiratory distress.     Breath sounds: Normal breath sounds.  Abdominal:     General: Abdomen is flat.     Palpations: Abdomen is soft.     Tenderness: There is no abdominal tenderness.  Musculoskeletal:        General: No swelling, tenderness or deformity.     Cervical back: Neck supple. No rigidity or tenderness.  Skin:    General: Skin is warm and dry.     Findings: No erythema or rash.  Neurological:     General: No focal deficit present.     Mental Status: She is alert and oriented to person, place, and time.  Psychiatric:        Mood and Affect: Mood normal.        Behavior: Behavior normal.     ED Results / Procedures / Treatments   Labs (all labs ordered are listed, but only abnormal results are displayed) Labs Reviewed  BASIC METABOLIC PANEL - Abnormal; Notable for the following components:      Result Value   Potassium 3.1 (*)    Glucose, Bld 112 (*)    Calcium 8.8 (*)    All other components within normal limits  CBC - Abnormal; Notable for the following components:   Hemoglobin 8.7 (*)    HCT 30.5 (*)    MCV 70.6 (*)    MCH 20.1 (*)    MCHC 28.5 (*)    RDW 17.1 (*)    Platelets 461 (*)    All other components within normal limits  I-STAT BETA HCG BLOOD, ED ([facility], [facility], [facility] ONLY)  TROPONIN I (HIGH SENSITIVITY)  TROPONIN I (HIGH SENSITIVITY)    EKG EKG Interpretation  Date/Time:  Saturday January 12 2020 15:02:23 EDT Ventricular Rate:  97 PR Interval:    QRS Duration: 89 QT Interval:  348 QTC Calculation: 442 R Axis:   30 Text Interpretation: Sinus rhythm No STEMI Confirmed by Octaviano Glow [phone]) on 01/12/2020 3:41:10 PM   Radiology DG Chest 2 View  Result Date: 01/12/2020 CLINICAL DATA:  Chest pain EXAM: CHEST - 2 VIEW COMPARISON:  None. FINDINGS: Lungs are clear. Heart size and pulmonary vascularity are normal. No  adenopathy. No pneumothorax. No bone lesions. IMPRESSION: Lungs clear.  Cardiac silhouette normal. Electronically Signed   By: Lowella Grip III M.D.   On: 01/12/2020 13:57    Procedures Procedures (  including critical care time)  Medications Ordered in ED Medications  sodium chloride flush (NS) 0.9 % injection 3 mL (3 mLs Intravenous Not Given 01/12/20 1515)  alum & mag hydroxide-simeth (MAALOX/MYLANTA) 200-200-20 MG/5ML suspension 30 mL (30 mLs Oral Given 01/12/20 1527)    And  lidocaine (XYLOCAINE) 2 % viscous mouth solution 15 mL (15 mLs Oral Given 01/12/20 1528)    ED Course  I have reviewed the triage vital signs and the nursing notes.  Pertinent labs & imaging results that were available during my care of the patient were reviewed by me and considered in my medical decision making (see chart for details).    MDM Rules/Calculators/A&P                          34 year old female with chest burning, throat burning, difficulty swallowing On presentation, the patient is alert and oriented, nontoxic-appearing, no acute distress, resting comfortably in the ER bed.  When I came to examine her, she is requesting to go home.  She was hypertensive slightly and slightly tachycardic on arrival, but other vitals are reassuring.  She is tolerating her secretions well, with no evidence of voice change, or respiratory distress.  Physical exam without any acute abnormalities.  BMP with slightly decreased potassium of 3.1, normal creatinine.  CBC without leukocytosis.  Initial troponin <2.  Chest x-ray without acute cardiopulmonary abnormalities, EKG normal sinus rhythm.  Doubt ACS, no evidence of peritonsillar abscess, allergic reaction, Ludwig angina.   Suspect that this is GERD but esophageal impaction is still in differential. She has not tried to eat or drink anything since this episode, will p.o. challenge her and give her GI cocktail.   I suspect that the patient will be stable for discharge.    3:59 PM: On my reevaluation, the patient is well-appearing and is again asking to go home.  She tolerated p.o. challenge well.  Reports improvement of symptoms with GI cocktail.  Doubt esophageal impaction/stricture.  Suspect likely to GERD.  We discussed her low potassium and her anemia.  I encouraged her to eat high potassium foods.  Encouraged her to follow-up with her PCP.  I encouraged her to take over-the-counter PPIs for her GERD symptoms and provided names.  She is overall reassured, voices understanding, and is agreeable to the plan.  Return precautions given.  At this stage in the ED course, the patient has been adequately screened and is stable for discharge.  I discussed the case with Dr. Langston Masker who is agreeable to the above plan.   Final Clinical Impression(s) / ED Diagnoses Final diagnoses:  Gastric reflux    Rx / DC Orders ED Discharge Orders    None       Garald Balding, PA-C 01/12/20 1601    Wyvonnia Dusky, MD 01/13/20 [phone]

## 2020-05-02 ENCOUNTER — Other Ambulatory Visit: Payer: Self-pay

## 2020-05-02 ENCOUNTER — Encounter: Payer: Self-pay | Admitting: Family Medicine

## 2020-05-02 ENCOUNTER — Ambulatory Visit (INDEPENDENT_AMBULATORY_CARE_PROVIDER_SITE_OTHER): Payer: 59 | Admitting: Family Medicine

## 2020-05-02 VITALS — BP 110/68 | HR 83 | Ht 65.0 in | Wt 193.0 lb

## 2020-05-02 DIAGNOSIS — N921 Excessive and frequent menstruation with irregular cycle: Secondary | ICD-10-CM

## 2020-05-02 DIAGNOSIS — Z6832 Body mass index (BMI) 32.0-32.9, adult: Secondary | ICD-10-CM | POA: Insufficient documentation

## 2020-05-02 DIAGNOSIS — D500 Iron deficiency anemia secondary to blood loss (chronic): Secondary | ICD-10-CM | POA: Insufficient documentation

## 2020-05-02 DIAGNOSIS — Z0000 Encounter for general adult medical examination without abnormal findings: Secondary | ICD-10-CM

## 2020-05-02 DIAGNOSIS — E669 Obesity, unspecified: Secondary | ICD-10-CM

## 2020-05-02 DIAGNOSIS — Z1329 Encounter for screening for other suspected endocrine disorder: Secondary | ICD-10-CM

## 2020-05-02 HISTORY — DX: Excessive and frequent menstruation with irregular cycle: N92.1

## 2020-05-02 NOTE — Progress Notes (Signed)
Subjective:    Patient ID: Kristina Leon, female    DOB: November 25, 1985, 34 y.o.   MRN: [id number]  HPI Chief Complaint  Patient presents with   Annual Exam    not fasting-breakfast at 9    She is here for a complete physical exam. Previous medical care: moved here from [place] in 2012.  Last PCP moved away in 2016   She sees an OB/GYN.   Anemia- chronic. Reports heavy and irregular periods. Often has 2 periods per month.  Reports having fatigue. No dizziness, chest pain, palpitations, shortness of breath.    Social history: Lives with siblings and father. States she is engaged. No children. works at [employer], third shift  Denies smoking, drinking alcohol, drug use  Diet: fairly healthy  Excerise: none   Immunizations: reports they are UTD, not all on file   Health maintenance:  Mammogram: N/A Colonoscopy: N/A Last Gynecological Exam: June 2021  Last Menstrual cycle: 04/11/2020  Last Dental Exam: 2 months ago  Last Eye Exam: March 2021   Wears seatbelt always, smoke detectors in home and functioning, does not text while driving and feels safe in home environment.   Reviewed allergies, medications, past medical, surgical, family, and social history.     Review of Systems Review of Systems Constitutional: -fever, -chills, -sweats, -unexpected weight change,-fatigue ENT: -runny nose, -ear pain, -sore throat Cardiology:  -chest pain, -palpitations, -edema Respiratory: -cough, -shortness of breath, -wheezing Gastroenterology: -abdominal pain, -nausea, -vomiting, -diarrhea, -constipation  Hematology: -bleeding or bruising problems Musculoskeletal: -arthralgias, -myalgias, -joint swelling, -back pain Ophthalmology: -vision changes Urology: -dysuria, -difficulty urinating, -hematuria, -urinary frequency, -urgency Neurology: -headache, -weakness, -tingling, -numbness       Objective:   Physical Exam BP 110/68    Pulse 83    Ht 5\' 5"  (1.651 m)    Wt 193 lb (87.5 kg)     SpO2 99%    BMI 32.12 kg/m   General Appearance:    Alert, cooperative, no distress, appears stated age  Head:    Normocephalic, without obvious abnormality, atraumatic  Eyes:    PERRL, conjunctiva/corneas clear, EOM's intact  Ears:    Normal TM's and external ear canals  Nose:   Mask on   Throat:   Mask on   Neck:   Supple, no lymphadenopathy;  thyroid:  no   enlargement/tenderness/nodules; no JVD  Back:    Spine nontender, no curvature, ROM normal, no CVA     tenderness  Lungs:     Clear to auscultation bilaterally without wheezes, rales or     ronchi; respirations unlabored  Chest Wall:    No tenderness or deformity   Heart:    Regular rate and rhythm, S1 and S2 normal, no murmur, rub   or gallop  Breast Exam:    OB/GYN  Abdomen:     Soft, non-tender, nondistended, normoactive bowel sounds,    no masses, no hepatosplenomegaly  Genitalia:    OB/GYN  Rectal:    Not performed due to age<40 and no related complaints  Extremities:   No clubbing, cyanosis or edema  Pulses:   2+ and symmetric all extremities  Skin:   Skin color, texture, turgor normal, no rashes or lesions  Lymph nodes:   Cervical, supraclavicular, and axillary nodes normal  Neurologic:   CNII-XII intact, normal strength, sensation and gait           Psych:   Normal mood, affect, hygiene and grooming.  Assessment & Plan:  Routine general medical examination at a health care facility - Plan: CBC with Differential/Platelet, Comprehensive metabolic panel, TSH, T4, free, Lipid panel -Preventive health care reviewed.  She has an OB/GYN.  Counseled on healthy lifestyle including diet and exercise.  Immunizations reviewed and not all are on file.  Discussed safety and health promotion.  Congratulated her on her engagement.  Menorrhagia with irregular cycle - Plan: TSH, T4, free, Iron, TIBC and Ferritin Panel -Check labs, start back on oral iron and follow-up with OB/GYN.  Discussed that she may benefit from hormone  therapy to help control her cycles.  History of fibroids  Iron deficiency anemia due to chronic blood loss - Plan: Iron, TIBC and Ferritin Panel -Start back on oral iron once I have her results.  Follow-up with OB/GYN and get her menstrual cycles under better control.  She has a history of fibroids with myomectomy   Obesity (BMI 30-39.9) - Plan: TSH, T4, free, Lipid panel -Recommend healthy diet and exercise  Screening for thyroid disorder - Plan: TSH, T4, free

## 2020-05-02 NOTE — Patient Instructions (Signed)
Preventive Care 21-34 Years Old, Female Preventive care refers to visits with your health care provider and lifestyle choices that can promote health and wellness. This includes:  A yearly physical exam. This may also be called an annual well check.  Regular dental visits and eye exams.  Immunizations.  Screening for certain conditions.  Healthy lifestyle choices, such as eating a healthy diet, getting regular exercise, not using drugs or products that contain nicotine and tobacco, and limiting alcohol use. What can I expect for my preventive care visit? Physical exam Your health care provider will check your:  Height and weight. This may be used to calculate body mass index (BMI), which tells if you are at a healthy weight.  Heart rate and blood pressure.  Skin for abnormal spots. Counseling Your health care provider may ask you questions about your:  Alcohol, tobacco, and drug use.  Emotional well-being.  Home and relationship well-being.  Sexual activity.  Eating habits.  Work and work environment.  Method of birth control.  Menstrual cycle.  Pregnancy history. What immunizations do I need?  Influenza (flu) vaccine  This is recommended every year. Tetanus, diphtheria, and pertussis (Tdap) vaccine  You may need a Td booster every 10 years. Varicella (chickenpox) vaccine  You may need this if you have not been vaccinated. Human papillomavirus (HPV) vaccine  If recommended by your health care provider, you may need three doses over 6 months. Measles, mumps, and rubella (MMR) vaccine  You may need at least one dose of MMR. You may also need a second dose. Meningococcal conjugate (MenACWY) vaccine  One dose is recommended if you are age 19-21 years and a first-year college student living in a residence hall, or if you have one of several medical conditions. You may also need additional booster doses. Pneumococcal conjugate (PCV13) vaccine  You may need  this if you have certain conditions and were not previously vaccinated. Pneumococcal polysaccharide (PPSV23) vaccine  You may need one or two doses if you smoke cigarettes or if you have certain conditions. Hepatitis A vaccine  You may need this if you have certain conditions or if you travel or work in places where you may be exposed to hepatitis A. Hepatitis B vaccine  You may need this if you have certain conditions or if you travel or work in places where you may be exposed to hepatitis B. Haemophilus influenzae type b (Hib) vaccine  You may need this if you have certain conditions. You may receive vaccines as individual doses or as more than one vaccine together in one shot (combination vaccines). Talk with your health care provider about the risks and benefits of combination vaccines. What tests do I need?  Blood tests  Lipid and cholesterol levels. These may be checked every 5 years starting at age 20.  Hepatitis C test.  Hepatitis B test. Screening  Diabetes screening. This is done by checking your blood sugar (glucose) after you have not eaten for a while (fasting).  Sexually transmitted disease (STD) testing.  BRCA-related cancer screening. This may be done if you have a family history of breast, ovarian, tubal, or peritoneal cancers.  Pelvic exam and Pap test. This may be done every 3 years starting at age 21. Starting at age 30, this may be done every 5 years if you have a Pap test in combination with an HPV test. Talk with your health care provider about your test results, treatment options, and if necessary, the need for more tests.   Follow these instructions at home: Eating and drinking   Eat a diet that includes fresh fruits and vegetables, whole grains, lean protein, and low-fat dairy.  Take vitamin and mineral supplements as recommended by your health care provider.  Do not drink alcohol if: ? Your health care provider tells you not to drink. ? You are  pregnant, may be pregnant, or are planning to become pregnant.  If you drink alcohol: ? Limit how much you have to 0-1 drink a day. ? Be aware of how much alcohol is in your drink. In the [place] one drink equals one 12 oz bottle of beer (355 mL), one 5 oz glass of wine (148 mL), or one 1 oz glass of hard liquor (44 mL). Lifestyle  Take daily care of your teeth and gums.  Stay active. Exercise for at least 30 minutes on 5 or more days each week.  Do not use any products that contain nicotine or tobacco, such as cigarettes, e-cigarettes, and chewing tobacco. If you need help quitting, ask your health care provider.  If you are sexually active, practice safe sex. Use a condom or other form of birth control (contraception) in order to prevent pregnancy and STIs (sexually transmitted infections). If you plan to become pregnant, see your health care provider for a preconception visit. What's next?  Visit your health care provider once a year for a well check visit.  Ask your health care provider how often you should have your eyes and teeth checked.  Stay up to date on all vaccines. This information is not intended to replace advice given to you by your health care provider. Make sure you discuss any questions you have with your health care provider. Document Revised: 03/30/2018 Document Reviewed: 03/30/2018 Elsevier Patient Education  2020 [employer].

## 2020-05-03 LAB — COMPREHENSIVE METABOLIC PANEL
ALT: 7 IU/L (ref 0–32)
AST: 12 IU/L (ref 0–40)
Albumin/Globulin Ratio: 1.2 (ref 1.2–2.2)
Albumin: 4 g/dL (ref 3.8–4.8)
Alkaline Phosphatase: 71 IU/L (ref 44–121)
BUN/Creatinine Ratio: 20 (ref 9–23)
BUN: 13 mg/dL (ref 6–20)
Bilirubin Total: 0.2 mg/dL (ref 0.0–1.2)
CO2: 24 mmol/L (ref 20–29)
Calcium: 9.1 mg/dL (ref 8.7–10.2)
Chloride: 106 mmol/L (ref 96–106)
Creatinine, Ser: 0.64 mg/dL (ref 0.57–1.00)
GFR calc Af Amer: 135 mL/min/{1.73_m2} (ref >59)
GFR calc non Af Amer: 117 mL/min/{1.73_m2} (ref >59)
Globulin, Total: 3.3 g/dL (ref 1.5–4.5)
Glucose: 96 mg/dL (ref 65–99)
Potassium: 4.1 mmol/L (ref 3.5–5.2)
Sodium: 142 mmol/L (ref 134–144)
Total Protein: 7.3 g/dL (ref 6.0–8.5)

## 2020-05-03 LAB — LIPID PANEL
Chol/HDL Ratio: 3.3 ratio (ref 0.0–4.4)
Cholesterol, Total: 144 mg/dL (ref 100–199)
HDL: 44 mg/dL (ref >39)
LDL Chol Calc (NIH): 82 mg/dL (ref 0–99)
Triglycerides: 93 mg/dL (ref 0–149)
VLDL Cholesterol Cal: 18 mg/dL (ref 5–40)

## 2020-05-03 LAB — IRON,TIBC AND FERRITIN PANEL
Ferritin: 8 ng/mL — ABNORMAL LOW (ref 15–150)
Iron Saturation: 5 % — CL (ref 15–55)
Iron: 15 ug/dL — ABNORMAL LOW (ref 27–159)
Total Iron Binding Capacity: 333 ug/dL (ref 250–450)
UIBC: 318 ug/dL (ref 131–425)

## 2020-05-03 LAB — CBC WITH DIFFERENTIAL/PLATELET
Basophils Absolute: 0 10*3/uL (ref 0.0–0.2)
Basos: 0 %
EOS (ABSOLUTE): 0.2 10*3/uL (ref 0.0–0.4)
Eos: 3 %
Hematocrit: 32.7 % — ABNORMAL LOW (ref 34.0–46.6)
Hemoglobin: 9.5 g/dL — ABNORMAL LOW (ref 11.1–15.9)
Immature Grans (Abs): 0 10*3/uL (ref 0.0–0.1)
Immature Granulocytes: 0 %
Lymphocytes Absolute: 2.2 10*3/uL (ref 0.7–3.1)
Lymphs: 29 %
MCH: 19.9 pg — ABNORMAL LOW (ref 26.6–33.0)
MCHC: 29.1 g/dL — ABNORMAL LOW (ref 31.5–35.7)
MCV: 68 fL — ABNORMAL LOW (ref 79–97)
Monocytes Absolute: 0.7 10*3/uL (ref 0.1–0.9)
Monocytes: 9 %
Neutrophils Absolute: 4.6 10*3/uL (ref 1.4–7.0)
Neutrophils: 59 %
Platelets: 496 10*3/uL — ABNORMAL HIGH (ref 150–450)
RBC: 4.78 x10E6/uL (ref 3.77–5.28)
RDW: 17.1 % — ABNORMAL HIGH (ref 11.7–15.4)
WBC: 7.8 10*3/uL (ref 3.4–10.8)

## 2020-05-03 LAB — TSH: TSH: 2.76 u[IU]/mL (ref 0.450–4.500)

## 2020-05-03 LAB — T4, FREE: Free T4: 1.31 ng/dL (ref 0.82–1.77)

## 2020-05-04 ENCOUNTER — Encounter: Payer: Self-pay | Admitting: Family Medicine

## 2020-05-04 NOTE — Progress Notes (Signed)
Please call and let her know that I strongly recommend seeing her OB/GYN for her heavy and irregular periods. She is anemic. I also think she will benefit from taking oral iron over the counter.

## 2020-05-07 ENCOUNTER — Other Ambulatory Visit: Payer: Self-pay

## 2020-05-07 ENCOUNTER — Encounter: Payer: Self-pay | Admitting: Obstetrics and Gynecology

## 2020-05-07 ENCOUNTER — Ambulatory Visit (INDEPENDENT_AMBULATORY_CARE_PROVIDER_SITE_OTHER): Payer: 59 | Admitting: Obstetrics and Gynecology

## 2020-05-07 VITALS — BP 130/80

## 2020-05-07 DIAGNOSIS — Z862 Personal history of diseases of the blood and blood-forming organs and certain disorders involving the immune mechanism: Secondary | ICD-10-CM | POA: Diagnosis not present

## 2020-05-07 DIAGNOSIS — N920 Excessive and frequent menstruation with regular cycle: Secondary | ICD-10-CM | POA: Diagnosis not present

## 2020-05-07 DIAGNOSIS — D259 Leiomyoma of uterus, unspecified: Secondary | ICD-10-CM

## 2020-05-07 MED ORDER — NORELGESTROMIN-ETH ESTRADIOL 150-35 MCG/24HR TD PTWK: 1.0000 | MEDICATED_PATCH | TRANSDERMAL | 4 refills | Status: DC

## 2020-05-07 NOTE — Progress Notes (Signed)
   TIMBERLYNN KIZZIAH 11/14/1985 [id number]  SUBJECTIVE:  33 y.o. G0P0000 female with known fibroid uterus presents for discussion of management options for her chronically heavy periods. Her periods are regular but very heavy when they do come. She has also been anemic with iron deficiency and has started on iron supplement at the direction of her primary care provider. Hemoglobin has been ranging from 8.7-9.5 in the past few months. Her thyroid screen was also normal recently. We had discussed after her routine annual exam earlier this year that she was anemic and should consider treating her heavy periods. She has never been on birth control before. Virginal. She is planning to get married in the next 3 to 4 months. She isn't non-smoker. She has a history of migraine headaches without any true aura symptoms.   No current outpatient medications on file.   No current facility-administered medications for this visit.   Allergies: Pork-derived products  Patient's last menstrual period was 04/12/2020.  Past medical history,surgical history, problem list, medications, allergies, family history and social history were all reviewed and documented as reviewed in the EPIC chart.  ROS: Pertinent positives and negatives as reviewed in HPI.   OBJECTIVE:  BP 130/80 (BP Location: Right Arm, Patient Position: Sitting, Cuff Size: Normal)   LMP 04/12/2020  The patient appears well, alert, oriented, in no distress. PELVIC EXAM: Deferred due to consultative nature of the visit   ASSESSMENT:  34 y.o. G0P0000 here for management of chronically heavy periods with anemia, known fibroid uterus  PLAN:  I reviewed her previous ultrasound imaging from 11/2015. She did have several fibroids which appeared mostly intramural and subserosal at that time. She is not having any new onset worsening of her period or abdominal discomfort symptoms. At her recent exam 01/2020 pelvic exam indicated a slightly enlarged uterus 10  weeks size at that time. Her Pap smear was normal in 2019.  She is planning to get married in the near future and would like to start trying to conceive shortly thereafter. For control of heavy menstrual periods in the short-term, we discussed hormonal contraception options and Lysteda. I discussed various options including oral contraceptive pills, patch, NuvaRing, cyclic or continuous Provera, norethindrone. We did not discuss the Depo shot or the LARC options because of her timeline.  Patient denies any known personal or family history of DVT, thromboembolic disease, or blood clotting disorder.  The patient has no current active problem with migraine headaches with aura.  She denies any problems with hypertension, liver cysts/dysfunction, or other contraindication. She was most interested in trying the patch. I sent in a prescription for Ortho Evra equivalent patch and discussed its use, weekly patch change, 4th week patch free, etc.  I discussed how hormonal contraceptives work, and some of the additional potential side effects including mood changes, weight gain, bloating, and breast changes/tenderness.  I cautioned her that in the first few to several months of use she may not see much benefit in her menstrual reduction and/or may have episodes of irregular bleeding but it should become better regulated with continued use.     Joseph Pierini MD 05/07/20

## 2020-05-07 NOTE — Patient Instructions (Signed)
Ethinyl Estradiol; Norelgestromin skin patches What is this medicine? ETHINYL ESTRADIOL;NORELGESTROMIN (ETH in il es tra DYE ole; nor el JES troe min) skin patch is used as a contraceptive (birth control method). This medicine combines two types of female hormones, an estrogen and a progestin. This patch is used to prevent ovulation and pregnancy. This medicine may be used for other purposes; ask your health care provider or pharmacist if you have questions. COMMON BRAND NAME(S): Ortho Becky Sax What should I tell my health care provider before I take this medicine? They need to know if you have or ever had any of these conditions:  abnormal vaginal bleeding  blood vessel disease or blood clots  breast, cervical, endometrial, ovarian, liver, or uterine cancer  diabetes  gallbladder disease  having surgery  heart disease or recent heart attack  high blood pressure  high cholesterol or triglycerides  history of irregular heartbeat or heart valve problems  kidney disease  liver disease  migraine headaches  protein C deficiency  protein S deficiency  recently had a baby, miscarriage, or abortion  stroke  systemic lupus erythematosus (SLE)  tobacco smoker  an unusual or allergic reaction to estrogens, progestins, other medicines, foods, dyes, or preservatives  pregnant or trying to get pregnant  breast-feeding How should I use this medicine? This patch is applied to the skin. Follow the directions on the prescription label. Apply to clean, dry, healthy skin on the buttock, abdomen, upper outer arm or upper torso, in a place where it will not be rubbed by tight clothing. Do not use lotions or other cosmetics on the site where the patch will go. Press the patch firmly in place for 10 seconds to ensure good contact with the skin. Change the patch every 7 days on the same day of the week for 3 weeks. You will then have a break from the patch for 1 week, after which you  will apply a new patch. Do not use your medicine more often than directed. Contact your pediatrician regarding the use of this medicine in children. Special care may be needed. This medicine has been used in female children who have started having menstrual periods. A patient package insert for the product will be given with each prescription and refill. Read this sheet carefully each time. The sheet may change frequently. Overdosage: If you think you have taken too much of this medicine contact a poison control center or emergency room at once. NOTE: This medicine is only for you. Do not share this medicine with others. What if I miss a dose? You will need to replace your patch once a week as directed. If your patch is lost or falls off, contact your health care professional for advice. You may need to use another form of birth control if your patch has been off for more than 1 day. What may interact with this medicine? Do not take this medicine with the following medications:  dasabuvir; ombitasvir; paritaprevir; ritonavir  ombitasvir; paritaprevir; ritonavir This medicine may also interact with the following medications:  acetaminophen  antibiotics or medicines for infections, especially rifampin, rifabutin, rifapentine, and possibly penicillins or tetracyclines  aprepitant or fosaprepitant  armodafinil  ascorbic acid (vitamin C)  barbiturate medicines, such as phenobarbital or primidone  bosentan  certain antiviral medicines for hepatitis, HIV or AIDS  certain medicines for cancer treatment  certain medicines for seizures like carbamazepine, clobazam, felbamate, lamotrigine, oxcarbazepine, phenytoin, rufinamide, topiramate  certain medicines for treating high cholesterol  cyclosporine  dantrolene  elagolix  flibanserin  grapefruit juice  lesinurad  medicines for diabetes  medicines to treat fungal infections, such as griseofulvin, miconazole, fluconazole,  ketoconazole, itraconazole, posaconazole or voriconazole  mifepristone  mitotane  modafinil  morphine  mycophenolate  St. John's wort  tamoxifen  temazepam  theophylline or aminophylline  thyroid hormones  tizanidine  tranexamic acid  ulipristal  warfarin This list may not describe all possible interactions. Give your health care provider a list of all the medicines, herbs, non-prescription drugs, or dietary supplements you use. Also tell them if you smoke, drink alcohol, or use illegal drugs. Some items may interact with your medicine. What should I watch for while using this medicine? Visit your doctor or health care professional for regular checks on your progress. You will need a regular breast and pelvic exam and Pap smear while on this medicine. Use an additional method of contraception during the first cycle that you use this patch. If you have any reason to think you are pregnant, stop using this medicine right away and contact your doctor or health care professional. If you are using this medicine for hormone related problems, it may take several cycles of use to see improvement in your condition. Smoking increases the risk of getting a blood clot or having a stroke while you are using hormonal birth control, especially if you are more than 34 years old. You are strongly advised not to smoke. This medicine can make your body retain fluid, making your fingers, hands, or ankles swell. Your blood pressure can go up. Contact your doctor or health care professional if you feel you are retaining fluid. This medicine can make you more sensitive to the sun. Keep out of the sun. If you cannot avoid being in the sun, wear protective clothing and use sunscreen. Do not use sun lamps or tanning beds/booths. If you wear contact lenses and notice visual changes, or if the lenses begin to feel uncomfortable, consult your eye care specialist. In some women, tenderness, swelling, or  minor bleeding of the gums may occur. Notify your dentist if this happens. Brushing and flossing your teeth regularly may help limit this. See your dentist regularly and inform your dentist of the medicines you are taking. If you are going to have elective surgery or a MRI, you may need to stop using this medicine before the surgery or MRI. Consult your health care professional for advice. This medicine does not protect you against HIV infection (AIDS) or any other sexually transmitted diseases. What side effects may I notice from receiving this medicine? Side effects that you should report to your doctor or health care professional as soon as possible:  allergic reactions such as skin rash or itching, hives, swelling of the lips, mouth, tongue, or throat  breast tissue changes or discharge  dark patches of skin on your forehead, cheeks, upper lip, and chin  depression  high blood pressure  migraines or severe, sudden headaches  missed menstrual periods  signs and symptoms of a blood clot such as breathing problems; changes in vision; chest pain; severe, sudden headache; pain, swelling, warmth in the leg; trouble speaking; sudden numbness or weakness of the face, arm or leg  skin reactions at the patch site such as blistering, bleeding, itching, rash, or swelling  stomach pain  yellowing of the eyes or skin Side effects that usually do not require medical attention (report these to your doctor or health care professional if they continue or are bothersome):  breast tenderness  irregular vaginal bleeding or spotting, particularly during the first 3 months of use  headache  nausea  painful menstrual periods  skin redness or mild irritation at site where applied  weight gain (slight) This list may not describe all possible side effects. Call your doctor for medical advice about side effects. You may report side effects to FDA at 1-800-FDA-1088. Where should I keep my  medicine? Keep out of the reach of children. Store at room temperature between 15 and 30 degrees C (59 and 86 degrees F). Keep the patch in its pouch until time of use. Throw away any unused medicine after the expiration date. Dispose of used patches properly. Since a used patch may still contain active hormones, fold the patch in half so that it sticks to itself prior to disposal. Throw away in a place where children or pets cannot reach. NOTE: This sheet is a summary. It may not cover all possible information. If you have questions about this medicine, talk to your doctor, pharmacist, or health care provider.  2020 Elsevier/Gold Standard (2018-10-24 11:56:29)  

## 2020-05-09 ENCOUNTER — Ambulatory Visit: Payer: 59 | Admitting: Obstetrics and Gynecology

## 2020-08-12 ENCOUNTER — Telehealth: Payer: 59 | Admitting: Family Medicine

## 2020-08-12 ENCOUNTER — Other Ambulatory Visit (INDEPENDENT_AMBULATORY_CARE_PROVIDER_SITE_OTHER): Payer: 59

## 2020-08-12 ENCOUNTER — Encounter: Payer: Self-pay | Admitting: Family Medicine

## 2020-08-12 ENCOUNTER — Other Ambulatory Visit: Payer: Self-pay

## 2020-08-12 VITALS — Wt 195.0 lb

## 2020-08-12 DIAGNOSIS — R112 Nausea with vomiting, unspecified: Secondary | ICD-10-CM

## 2020-08-12 DIAGNOSIS — J029 Acute pharyngitis, unspecified: Secondary | ICD-10-CM | POA: Diagnosis not present

## 2020-08-12 DIAGNOSIS — K5900 Constipation, unspecified: Secondary | ICD-10-CM

## 2020-08-12 DIAGNOSIS — R509 Fever, unspecified: Secondary | ICD-10-CM | POA: Diagnosis not present

## 2020-08-12 DIAGNOSIS — R52 Pain, unspecified: Secondary | ICD-10-CM

## 2020-08-12 DIAGNOSIS — R059 Cough, unspecified: Secondary | ICD-10-CM | POA: Diagnosis not present

## 2020-08-12 LAB — POC INFLUENZA A&B (BINAX/QUICKVUE)
Influenza A, POC: NEGATIVE
Influenza B, POC: NEGATIVE

## 2020-08-12 LAB — POC COVID19 BINAXNOW: SARS Coronavirus 2 Ag: NEGATIVE

## 2020-08-12 MED ORDER — ONDANSETRON 4 MG PO TBDP: 4.0000 mg | ORAL_TABLET | Freq: Three times a day (TID) | ORAL | 0 refills | Status: DC | PRN

## 2020-08-12 NOTE — Progress Notes (Signed)
   Subjective:  Documentation for virtual audio and video telecommunications through [facility] encounter: The call had to be completed via telephone call.   The patient was located at home. 2 patient identifiers used.  The provider was located in the office. The patient did consent to this visit and is aware of possible charges through their insurance for this visit.  The other persons participating in this telemedicine service were none. Time spent on call was 16 minutes and in review of previous records >20 minutes total.  This virtual service is not related to other E/M service within previous 7 days.   Patient ID: Kristina Leon, female    DOB: 20-May-1986, 35 y.o.   MRN: [id number]  HPI Chief Complaint  Patient presents with  . other    Covid symptoms sneezing, head ache, body ache, vomiting, minor fever, and weakness started last Thursday.    Complains of 5 day history of fever, chills, headache, body aches, sneezing, sore throat, cough that occasionally is productive of thick sputum, abdominal pain which she thinks is related to constipation, and nausea and vomiting.  States she has had 2 episodes of vomiting.  States she has been constipated prior to new symptoms due to taking iron.  She has started taking stool softeners.  Denies chest pain, shortness of breath or diarrhea.  She did not get her Covid or flu vaccines. States she has had Covid exposures at work.  She works at [employer].   Reviewed allergies, medications, past medical, surgical, family, and social history.   Review of Systems Pertinent positives and negatives in the history of present illness.     Objective:   Physical Exam Wt 195 lb (88.5 kg)   LMP  (LMP Unknown)   BMI 32.45 kg/m   Alert and oriented and in no acute distress.  She is nasally congested with drainage apparent during the visit.  Coughing during the visit.  Speaking in complete sentences without difficulty.      Assessment & Plan:   Fever and chills - Plan: POC COVID-19 BinaxNow, Novel Coronavirus, NAA ([facility]), POC Influenza A&B(BINAX/QUICKVUE)  Body aches - Plan: POC COVID-19 BinaxNow, Novel Coronavirus, NAA ([facility]), POC Influenza A&B(BINAX/QUICKVUE)  Acute pharyngitis, unspecified etiology - Plan: POC COVID-19 BinaxNow, Novel Coronavirus, NAA ([facility]), POC Influenza A&B(BINAX/QUICKVUE)  Cough - Plan: POC COVID-19 BinaxNow, Novel Coronavirus, NAA ([facility]), POC Influenza A&B(BINAX/QUICKVUE)  Non-intractable vomiting with nausea, unspecified vomiting type - Plan: POC COVID-19 BinaxNow, Novel Coronavirus, NAA ([facility]), POC Influenza A&B(BINAX/QUICKVUE), ondansetron (ZOFRAN ODT) 4 MG disintegrating tablet  Constipation, unspecified constipation type  She is quite ill with multiple symptoms that are suspicious for COVID.  She has also had exposure to COVID.  She has not been vaccinated. She will come to our office parking lot for testing. Advised to quarantine and discussed what this looks like in her household as well as in the community. Discussed supportive care. I will follow-up pending her results. She is aware that if she gets significantly worse especially if she is unable to keep fluids down and becomes dehydrated that she will need to go to the emergency department.

## 2020-08-13 LAB — SARS-COV-2, NAA 2 DAY TAT

## 2020-08-13 LAB — NOVEL CORONAVIRUS, NAA: SARS-CoV-2, NAA: DETECTED — AB

## 2020-08-13 NOTE — Progress Notes (Signed)
Her PCR Covid test is positive so she does have a Covid infection. Continue to treat her symptoms and quarantine until symptoms resolve or until day 10 from the first day of her symptoms.

## 2021-01-07 ENCOUNTER — Ambulatory Visit (INDEPENDENT_AMBULATORY_CARE_PROVIDER_SITE_OTHER): Payer: 59 | Admitting: Nurse Practitioner

## 2021-01-07 ENCOUNTER — Other Ambulatory Visit: Payer: Self-pay

## 2021-01-07 ENCOUNTER — Encounter: Payer: Self-pay | Admitting: Nurse Practitioner

## 2021-01-07 VITALS — BP 118/78 | Ht 64.5 in | Wt 213.0 lb

## 2021-01-07 DIAGNOSIS — D500 Iron deficiency anemia secondary to blood loss (chronic): Secondary | ICD-10-CM

## 2021-01-07 DIAGNOSIS — N921 Excessive and frequent menstruation with irregular cycle: Secondary | ICD-10-CM

## 2021-01-07 DIAGNOSIS — N939 Abnormal uterine and vaginal bleeding, unspecified: Secondary | ICD-10-CM | POA: Diagnosis not present

## 2021-01-07 DIAGNOSIS — Z01419 Encounter for gynecological examination (general) (routine) without abnormal findings: Secondary | ICD-10-CM | POA: Diagnosis not present

## 2021-01-07 DIAGNOSIS — D259 Leiomyoma of uterus, unspecified: Secondary | ICD-10-CM | POA: Diagnosis not present

## 2021-01-07 MED ORDER — MEGESTROL ACETATE 40 MG PO TABS: 40.0000 mg | ORAL_TABLET | Freq: Two times a day (BID) | ORAL | 1 refills | Status: DC

## 2021-01-07 NOTE — Patient Instructions (Signed)
Health Maintenance, Female Adopting a healthy lifestyle and getting preventive care are important in promoting health and wellness. Ask your health care provider about:  The right schedule for you to have regular tests and exams.  Things you can do on your own to prevent diseases and keep yourself healthy. What should I know about diet, weight, and exercise? Eat a healthy diet  Eat a diet that includes plenty of vegetables, fruits, low-fat dairy products, and lean protein.  Do not eat a lot of foods that are high in solid fats, added sugars, or sodium.   Maintain a healthy weight Body mass index (BMI) is used to identify weight problems. It estimates body fat based on height and weight. Your health care provider can help determine your BMI and help you achieve or maintain a healthy weight. Get regular exercise Get regular exercise. This is one of the most important things you can do for your health. Most adults should:  Exercise for at least 150 minutes each week. The exercise should increase your heart rate and make you sweat (moderate-intensity exercise).  Do strengthening exercises at least twice a week. This is in addition to the moderate-intensity exercise.  Spend less time sitting. Even light physical activity can be beneficial. Watch cholesterol and blood lipids Have your blood tested for lipids and cholesterol at 35 years of age, then have this test every 5 years. Have your cholesterol levels checked more often if:  Your lipid or cholesterol levels are high.  You are older than 35 years of age.  You are at high risk for heart disease. What should I know about cancer screening? Depending on your health history and family history, you may need to have cancer screening at various ages. This may include screening for:  Breast cancer.  Cervical cancer.  Colorectal cancer.  Skin cancer.  Lung cancer. What should I know about heart disease, diabetes, and high blood  pressure? Blood pressure and heart disease  High blood pressure causes heart disease and increases the risk of stroke. This is more likely to develop in people who have high blood pressure readings, are of African descent, or are overweight.  Have your blood pressure checked: ? Every 3-5 years if you are 18-39 years of age. ? Every year if you are 40 years old or older. Diabetes Have regular diabetes screenings. This checks your fasting blood sugar level. Have the screening done:  Once every three years after age 40 if you are at a normal weight and have a low risk for diabetes.  More often and at a younger age if you are overweight or have a high risk for diabetes. What should I know about preventing infection? Hepatitis B If you have a higher risk for hepatitis B, you should be screened for this virus. Talk with your health care provider to find out if you are at risk for hepatitis B infection. Hepatitis C Testing is recommended for:  Everyone born from 1945 through 1965.  Anyone with known risk factors for hepatitis C. Sexually transmitted infections (STIs)  Get screened for STIs, including gonorrhea and chlamydia, if: ? You are sexually active and are younger than 35 years of age. ? You are older than 35 years of age and your health care provider tells you that you are at risk for this type of infection. ? Your sexual activity has changed since you were last screened, and you are at increased risk for chlamydia or gonorrhea. Ask your health care provider   if you are at risk.  Ask your health care provider about whether you are at high risk for HIV. Your health care provider may recommend a prescription medicine to help prevent HIV infection. If you choose to take medicine to prevent HIV, you should first get tested for HIV. You should then be tested every 3 months for as long as you are taking the medicine. Pregnancy  If you are about to stop having your period (premenopausal) and  you may become pregnant, seek counseling before you get pregnant.  Take 400 to 800 micrograms (mcg) of folic acid every day if you become pregnant.  Ask for birth control (contraception) if you want to prevent pregnancy. Osteoporosis and menopause Osteoporosis is a disease in which the bones lose minerals and strength with aging. This can result in bone fractures. If you are 65 years old or older, or if you are at risk for osteoporosis and fractures, ask your health care provider if you should:  Be screened for bone loss.  Take a calcium or vitamin D supplement to lower your risk of fractures.  Be given hormone replacement therapy (HRT) to treat symptoms of menopause. Follow these instructions at home: Lifestyle  Do not use any products that contain nicotine or tobacco, such as cigarettes, e-cigarettes, and chewing tobacco. If you need help quitting, ask your health care provider.  Do not use street drugs.  Do not share needles.  Ask your health care provider for help if you need support or information about quitting drugs. Alcohol use  Do not drink alcohol if: ? Your health care provider tells you not to drink. ? You are pregnant, may be pregnant, or are planning to become pregnant.  If you drink alcohol: ? Limit how much you use to 0-1 drink a day. ? Limit intake if you are breastfeeding.  Be aware of how much alcohol is in your drink. In the [place] one drink equals one 12 oz bottle of beer (355 mL), one 5 oz glass of wine (148 mL), or one 1 oz glass of hard liquor (44 mL). General instructions  Schedule regular health, dental, and eye exams.  Stay current with your vaccines.  Tell your health care provider if: ? You often feel depressed. ? You have ever been abused or do not feel safe at home. Summary  Adopting a healthy lifestyle and getting preventive care are important in promoting health and wellness.  Follow your health care provider's instructions about healthy  diet, exercising, and getting tested or screened for diseases.  Follow your health care provider's instructions on monitoring your cholesterol and blood pressure. This information is not intended to replace advice given to you by your health care provider. Make sure you discuss any questions you have with your health care provider. Document Revised: 07/12/2018 Document Reviewed: 07/12/2018 Elsevier Patient Education  2021 Elsevier Inc.  

## 2021-01-07 NOTE — Progress Notes (Signed)
Kristina Leon [birth year] [id number]   History:  35 y.o. G0 presents for annual exam. Irregular menses that are somewhat new for her, she is using Ortho Evra patch for menorrhagia d/t known uterine fibroids. Bleeding has increased in frequency, length, and amount for about 6 months. Since January she had had cycles every 3 weeks that are heavy and last 2 weeks each time. She is removing patch every 4th week, but menses are occurring while patch is in place. Has been on menses 4 days and is having clots. 2017 myomectomy. History of anemia due to heavy menses, taking iron supplement most days. [place]. Engaged with plans to marry in a couple of months. She plans to try to conceive right away. She is emotional during visit due to fear of not being able to become pregnant. Normal pap history.  Gynecologic History Patient's last menstrual period was 01/03/2021. Period Cycle (Days): 21 Period Duration (Days): 7 Period Pattern: (!) Irregular (every 2-3 weeks) Menstrual Flow: Heavy Dysmenorrhea: (!) Severe Dysmenorrhea Symptoms: Cramping Contraception/Family planning: abstinence  Health Maintenance Last Pap: 12/30/2017. Results were: normal Last mammogram: Not indicated Last colonoscopy: Not indicated Last Dexa: Not indicated  Past medical history, past surgical history, family history and social history were all reviewed and documented in the EPIC chart. Engaged. Works for [employer] was performed and pertinent positives and negatives are included.  Exam:  Vitals:   01/07/21 1154  BP: 118/78  Weight: 213 lb (96.6 kg)  Height: 5' 4.5" (1.638 m)   Body mass index is 36 kg/m.  General appearance:  Normal Thyroid:  Symmetrical, normal in size, without palpable masses or nodularity. Respiratory  Auscultation:  Clear without wheezing or rhonchi Cardiovascular  Auscultation:  Regular rate, without rubs, murmurs or gallops  Edema/varicosities:  Not grossly  evident Abdominal  Soft,nontender, without masses, guarding or rebound.  Liver/spleen:  No organomegaly noted  Hernia:  None appreciated  Skin  Inspection:  Grossly normal Breasts: Examined lying and sitting.   Right: Without masses, retractions, nipple discharge or axillary adenopathy.   Left: Without masses, retractions, nipple discharge or axillary adenopathy. Genitourinary   Inguinal/mons:  Normal without inguinal adenopathy  External genitalia:  Normal appearing vulva with no masses, tenderness, or lesions  BUS/Urethra/Skene's glands:  Normal  Vagina:  Unable to visualize due to bleeding  Cervix:  Unable to visualize due to bleeding  Uterus:  14 week fibroid uterus, midline and mobile, nontender  Adnexa/parametria:     Rt: Normal in size, without masses or tenderness.   Lt: Normal in size, without masses or tenderness.  Anus and perineum: Normal  Assessment/Plan:  35 y.o. G0 for annual exam.   Well female exam with routine gynecological exam - Education provided on SBEs, importance of preventative screenings, current guidelines, high calcium diet, regular exercise, and multivitamin daily. Labs with PCP.   Abnormal uterine bleeding - Plan: megestrol (MEGACE) 40 MG tablet twice daily x 10 days. Repeat if bleeding returns prior to ultrasound. CBC with Differential/Platelet, US PELVIS TRANSVAGINAL NON-OB (TV ONLY).   Iron deficiency anemia due to chronic blood loss - Plan: CBC with Differential/Platelet.   Menorrhagia with irregular cycle - Plan: US PELVIS TRANSVAGINAL NON-OB (TV ONLY). Irregular menses that are somewhat new for her, she is using Ortho Evra patch for menorrhagia d/t heavy bleeding suspected to be from uterine fibroids. Bleeding has increased in frequency, length, and amount for about 6 months. Since January she has had cycles every  3 weeks that are heavy and last 2 weeks each time. She is removing patch every 4th week, but menses are occurring while patch is in place.  Has been on menses 4 days and is having clots.   Uterine leiomyoma, unspecified location - Plan: US PELVIS TRANSVAGINAL NON-OB (TV ONLY). Large fibroid felt during exam, approximately 14 week size uterus.   Screening for cervical cancer - normal pap history. Will repeat at 5-year interval per guidelines.  Return in 1 year for annual.    Tamela Gammon DNP, 12:08 PM 01/07/2021

## 2021-01-09 LAB — CBC WITH DIFFERENTIAL/PLATELET
Basophils Absolute: 0 10*3/uL (ref 0.0–0.2)
Basos: 1 %
EOS (ABSOLUTE): 0.2 10*3/uL (ref 0.0–0.4)
Eos: 4 %
Hematocrit: 36.2 % (ref 34.0–46.6)
Hemoglobin: 10.9 g/dL — ABNORMAL LOW (ref 11.1–15.9)
Immature Grans (Abs): 0 10*3/uL (ref 0.0–0.1)
Immature Granulocytes: 0 %
Lymphocytes Absolute: 2.1 10*3/uL (ref 0.7–3.1)
Lymphs: 36 %
MCH: 24.9 pg — ABNORMAL LOW (ref 26.6–33.0)
MCHC: 30.1 g/dL — ABNORMAL LOW (ref 31.5–35.7)
MCV: 83 fL (ref 79–97)
Monocytes Absolute: 0.5 10*3/uL (ref 0.1–0.9)
Monocytes: 9 %
Neutrophils Absolute: 2.9 10*3/uL (ref 1.4–7.0)
Neutrophils: 50 %
Platelets: 374 10*3/uL (ref 150–450)
RBC: 4.38 x10E6/uL (ref 3.77–5.28)
RDW: 15.6 % — ABNORMAL HIGH (ref 11.7–15.4)
WBC: 5.8 10*3/uL (ref 3.4–10.8)

## 2021-01-21 ENCOUNTER — Ambulatory Visit: Payer: 59 | Admitting: Obstetrics and Gynecology

## 2021-01-21 ENCOUNTER — Other Ambulatory Visit: Payer: Self-pay

## 2021-01-21 ENCOUNTER — Telehealth: Payer: Self-pay | Admitting: Obstetrics and Gynecology

## 2021-01-21 ENCOUNTER — Ambulatory Visit (INDEPENDENT_AMBULATORY_CARE_PROVIDER_SITE_OTHER): Payer: 59

## 2021-01-21 ENCOUNTER — Encounter: Payer: Self-pay | Admitting: Obstetrics and Gynecology

## 2021-01-21 VITALS — BP 120/76

## 2021-01-21 DIAGNOSIS — N921 Excessive and frequent menstruation with irregular cycle: Secondary | ICD-10-CM

## 2021-01-21 DIAGNOSIS — D500 Iron deficiency anemia secondary to blood loss (chronic): Secondary | ICD-10-CM

## 2021-01-21 DIAGNOSIS — N939 Abnormal uterine and vaginal bleeding, unspecified: Secondary | ICD-10-CM

## 2021-01-21 DIAGNOSIS — Z3169 Encounter for other general counseling and advice on procreation: Secondary | ICD-10-CM

## 2021-01-21 DIAGNOSIS — D259 Leiomyoma of uterus, unspecified: Secondary | ICD-10-CM

## 2021-01-21 NOTE — Progress Notes (Signed)
GYNECOLOGY  VISIT   HPI: 35 y.o.   Single Black or African American Not Hispanic or Latino  female   [id number] with Patient's last menstrual period was 01/03/2021.   here for  U/S  The patient has a known h/o fibroids, heavy cycles and anemia.   She has a h/o laparotomy and multiple myomectomies by Dr Phineas Real in 6/17.  In October, 2021 she saw Dr Delilah Shan c/o menorrhagia. At that time her Hgb was 9.5 (up from 8.7 the year prior), her iron stores were low and her TSH was normal.  Dr Delilah Shan started her on the ortho evra patch. With the patch her cycles were coming monthly x 7 days, she would saturate a pad in 4 hours at most. In the last couple of months her cycles started getting heavier and lasting longer. In April her period lasted for 10 days. She didn't understand that she was to restart the patch one week after removing it, she thought she was supposed to restart the patch after her cycle ended. So she started the patch late after her April cycle. In May she bleed from the 5/9-5/24, so she stopped the patch. She then started bleeding on 01/03/21 heavily. She was saturating the largest pad in 2 hours. She was seen by Marny Lowenstein, NP on 01/07/21 and was started on Megace. Her heavy bleeding lasted for a total of 10 days, now she is spotting.  Her Hgb from 01/07/21 returned at 10.9 gm/dl. She is on daily iron.  .  Getting married in August or September. She plans to try and get pregnant right away.   GYNECOLOGIC HISTORY: Patient's last menstrual period was 01/03/2021. Contraception:patch (not currently) Menopausal hormone therapy: N/A        OB History     Gravida  0   Para  0   Term  0   Preterm  0   AB  0   Living  0      SAB  0   IAB  0   Ectopic  0   Multiple  0   Live Births                 Patient Active Problem List   Diagnosis Date Noted   Menorrhagia with irregular cycle 05/02/2020   Iron deficiency anemia due to chronic blood loss 05/02/2020   Obesity  (BMI 30-39.9) 05/02/2020   Acute kidney injury ([place]) 10/26/2016    Past Medical History:  Diagnosis Date   Headache    no aura   Iron deficiency anemia    Kidney stone    2009    Past Surgical History:  Procedure Laterality Date   BREAST SURGERY Right    cyst removed   CYSTECTOMY     BREAST   MYOMECTOMY N/A 01/06/2016   Procedure: MYOMECTOMY-abdominal;  Surgeon: Anastasio Auerbach, MD;  Location: [facility];  Service: Gynecology;  Laterality: N/A;    Current Outpatient Medications  Medication Sig Dispense Refill   megestrol (MEGACE) 40 MG tablet Take 1 tablet (40 mg total) by mouth 2 (two) times daily. 20 tablet 1   norelgestromin-ethinyl estradiol (ORTHO EVRA) 150-35 MCG/24HR transdermal patch Place 1 patch onto the skin once a week. Start the first Sunday after your period begins 12 patch 4   No current facility-administered medications for this visit.     ALLERGIES: Pork-derived products  Family History  Problem Relation Age of Onset   Anemia Mother    Anemia Sister  Breast cancer Maternal Aunt 39    Social History   Socioeconomic History   Marital status: Single    Spouse name: Not on file   Number of children: Not on file   Years of education: Not on file   Highest education level: Not on file  Occupational History   Not on file  Tobacco Use   Smoking status: Never   Smokeless tobacco: Never  Vaping Use   Vaping Use: Never used  Substance and Sexual Activity   Alcohol use: No    Alcohol/week: 0.0 standard drinks   Drug use: No   Sexual activity: Never    Comment: Virgin  Other Topics Concern   Not on file  Social History Narrative   Lives in [place], lives with parents and several sisters and brothers. From [place] originally, then lived in [place]. Speaks English well.    Social Determinants of Health   Financial Resource Strain: Not on file  Food Insecurity: Not on file  Transportation Needs: Not on file  Physical Activity: Not on file   Stress: Not on file  Social Connections: Not on file  Intimate Partner Violence: Not on file    ROS  PHYSICAL EXAMINATION:    BP 120/76   LMP 01/03/2021     General appearance: alert, cooperative and appears stated age  Pelvic ultrasound  Indications: fibroid uterus, abnormal uterine bleeding.  Findings:  Uterus 16.61 x 11.96 x 9.38 cm  Multiple intramural and subserosal myomas, one pedunculated myoma. No intracavitary fibroids, fibroids to abut the cavity.   1) 5.07 x 5.21 cm  2) 3.15 x 2.09 cm  3) 3.71 x 4.45 cm  4) 3.59 x 2.43 cm  5) 5.37 x 3.53 cm  6) 3.34 x 3.14 cm  Endometrium 8.46 mm, no obvious deviation of the endometrial cavity.   Left ovary 3.26 x 1.99 x 0.91 cm  Right ovary 3.33 x 2.11 x 2.40 cm  No free fluid   Impression:  Enlarged fibroid uterus Fibroids are intramural, subserosal and peduncultated No intracavitary myomas noted Normal adnexa bilaterally  Reviewed ultrasound images with the patient  1. Abnormal uterine bleeding Her bleeding was initially controlled with the Ortho Evra Patch, with detailed discussion on how to use the patch she wasn't using it correctly. The very heavy bleeding didn't start until she went off of the patch. -She will continue the megace, one tablet a day for the next week (trying to time ending the megace with her expected cycle -On the first day of her cycle she will restart the ortho evra patch, she may have irregular bleeding with restarting the patch -She will change the patch weekly x 3 weeks, then take one week off -She will restart the patch after one week even if she is still bleeding  2. Uterine leiomyoma, unspecified location Enlarged fibroid uterus, no fibroids appear to be deviating the cavity. H/O abdominal myomectomies in 2017.  -I would not recommend prophylactic myomectomies unless her symptoms are not able to be controlled or if she is having pregnancy issues. -She will retry the patch, when she  goes off of it she will try to get pregnant right away -Can use lysteda for 5 day of her cycle every month while she is trying to get pregnant. This should decrease her blood flow.   3. Iron deficiency anemia due to chronic blood loss Continue daily iron  4. Encounter for preconception consultation -Start prenatal vitamins -preconception information given -she will call if she  is having trouble getting pregnant or if her bleeding is problematic   In addition to reviewing the ultrasound images and results of her ultrasound, well over 30 minutes was spent in total patient care. This specifically involved taking an detail history (including determined that she has been taking her contraception incorrectly), and extensive counseling about treatment of her heavy cycles and preconception counseling. Medication changes were made and plans for further care.

## 2021-01-21 NOTE — Telephone Encounter (Signed)
Mychart message sent about lysteda

## 2021-01-21 NOTE — Patient Instructions (Signed)
Start prenatal vitamins  Preparing for Pregnancy If you are planning to become pregnant, talk to your health care provider about preconception care. This type of care helps you prepare for a safe and healthy pregnancy. During this visit, your health care provider will: Do a complete physical exam, including a Pap test. Take your complete medical history. Give you information, answer your questions, and help you resolve problems. Preconception checklist Medical history Tell your health care provider about any medical conditions you have or have had. Your pregnancy or your ability to become pregnant may be affected by long-term (chronic) conditions, such as: Diabetes. High blood pressure (hypertension). Thyroid problems. Tell your health care provider about your family's medical history and your partner's medical history. Tell your health care provider if you have or have had any sexually transmitted infections, orSTIs. These can affect your pregnancy. In some cases, they can be passed to your baby. If needed, discuss the benefits of genetic testing. This test checks for conditions that may be passed from parent to child. Tell your health care provider about: Any problems you had getting pregnant or while pregnant. Any medicines you take. These include vitamins, herbal supplements, and over-the-counter medicines. Your history of getting vaccines. Discuss any vaccines that you may need. Diet Ask your health care provider about what foods to eat in order to get a balance of nutrients. This is especially important when you are pregnant or preparing to become pregnant. It is recommended that women of childbearing age take a folic acid supplement of 400 mcg daily and eat foods rich in folic acid to prevent certain birth defects. Ask your health care provider to help you reach a healthy weight before pregnancy. If you are overweight, you may have a higher risk for certain problems. These include  hypertension, diabetes, and early (preterm) birth. If you are underweight, you are more likely to have a baby who has a low birth weight. Lifestyle, work, and home Let your health care provider know about: Any lifestyle habits that you have, such as use of alcohol, drugs, or tobacco products. Fun and leisure activities that may put you at risk during pregnancy, such as downhill skiing and certain exercise programs. Any plans to travel out of the country, especially to places with an active [place] virus outbreak. Harmful substances that you may be exposed to at work or at home. These include chemicals, pesticides, radiation, and substances from cat litter boxes. Any concerns you have for your safety at home. Mental health Tell your health care provider about: Any history of mental health conditions, including feelings of depression, sadness, or anxiety. Any medicines that you take for a mental health condition. These include herbs and supplements. How do I know that I am pregnant? You may be pregnant if you have been sexually active and you miss your period. Other symptoms of early pregnancy include: Mild cramping. Very light vaginal bleeding (spotting). Feeling more tired than usual. Nausea and vomiting. These may be signs of morning sickness. Take a home pregnancy test if you have any of these symptoms. This test checks for a hormone in your urine called human chorionic gonadotropin, or hCG. A woman's body begins to make this hormone during early pregnancy. These testsare very accurate. Wait until at least the first day after you miss your period to take a home pregnancy test. If the test shows that you are pregnant, call your health careprovider for a prenatal care visit. What should I do if I become pregnant?  Schedule a visit with your health care provider as soon as you suspect you are pregnant. Talk to your health care provider if you are taking prescription medicines to determine if they  are safe to take during pregnancy. You may continue to have sex if it does not cause pain or other problems, such as vaginal bleeding. Follow these instructions at home: Eating and drinking  Follow instructions from your health care provider about eating or drinking restrictions. Drink enough fluid to keep your urine pale yellow. Eat a balanced diet. This includes fresh fruits and vegetables, whole grains, lean meats, low-fat dairy products, healthy fats, and foods that are high in fiber. Ask to meet with a nutritionist or registered dietitian for help with meal planning and goals. Avoid eating raw or undercooked meat and seafood. Avoid eating or drinking unpasteurized dairy products.  Lifestyle     Get regular exercise. Try to be active for at least 30 minutes a day on most days of the week. Ask your health care provider which activities are safe during pregnancy. Maintain a healthy weight. Avoid toxic fumes and chemicals. Avoid cleaning cat litter boxes. Cat feces may contain a harmful parasite called toxoplasma. Avoid travel to countries where [place] virus is common. Do not use any products that contain nicotine or tobacco, such as cigarettes, e-cigarettes, and chewing tobacco. If you need help quitting, ask your health care provider. Do not drink alcohol or use drugs. General instructions Keep an accurate record of your menstrual periods. This makes it easier for your health care provider to determine your baby's due date. Take over-the-counter and prescription medicines only as told by your health care provider. Begin taking prenatal vitamins and folic acid supplements daily as directed. Manage any chronic conditions, such as hypertension and diabetes, as told by your health care provider. This is important. Summary If you are planning to become pregnant, talk to your health care provider about preconception care. This is an important part of planning for a healthy pregnancy. Women of  childbearing age should take 373 mcg of folic acid daily in addition to eating a diet rich in folic acid. This will prevent certain birth defects. Schedule a visit with your health care provider as soon as you suspect you are pregnant. Tell your health care provider about your medical history, lifestyle activities, home safety, and other things that may concern you. This information is not intended to replace advice given to you by your health care provider. Make sure you discuss any questions you have with your healthcare provider. Document Revised: 04/18/2019 Document Reviewed: 04/18/2019 Elsevier Patient Education  [place].

## 2021-02-05 ENCOUNTER — Telehealth: Payer: Self-pay

## 2021-02-05 NOTE — Telephone Encounter (Signed)
My CHart message from Dr. Talbert Nan returned unread. I called and left message for her to call the office so we can read it to her or she can login and read in My Chart.  "Hi Kristina Leon, It was very nice meeting you today. When you get to the point of stopping the patch to try and get pregnant. We can try to manage your cycles with a medication called lysteda (Dr Delilah Shan reviewed this with you in 10/21). This is a medication that you only take for 5 days of your cycle, it should make your bleeding lighter. There is a small risk of blood clots (you also have this risk with the ortho-evra patch). It is okay to take this medication when you are trying to get pregnant. You are only taking it during your heavy menses, not when you become pregnant. Please reach out when you are stopping your patch and I can prescribe it for you. Let me know if you have any questions. Have a lovely summer! Sumner Boast"

## 2021-02-10 ENCOUNTER — Other Ambulatory Visit: Payer: 59

## 2021-02-10 ENCOUNTER — Other Ambulatory Visit: Payer: 59 | Admitting: Obstetrics and Gynecology

## 2021-03-10 NOTE — Telephone Encounter (Signed)
I mailed patient a letter that included Dr. Gentry Fitz My Chart message.

## 2021-03-25 ENCOUNTER — Other Ambulatory Visit: Payer: Self-pay

## 2021-03-25 ENCOUNTER — Ambulatory Visit: Payer: 59 | Admitting: Medical

## 2021-03-25 VITALS — BP 120/70 | HR 92 | Temp 98.3°F | Wt 216.8 lb

## 2021-03-25 DIAGNOSIS — H9201 Otalgia, right ear: Secondary | ICD-10-CM

## 2021-03-25 DIAGNOSIS — H6981 Other specified disorders of Eustachian tube, right ear: Secondary | ICD-10-CM | POA: Diagnosis not present

## 2021-03-25 NOTE — Progress Notes (Signed)
Subjective:  Kristina Leon is a 35 y.o. female who presents for Chief Complaint  Patient presents with   right ear pain    Right ear pain. When walking she feels her steps in her ear. She had swimming lessons back in June and not sure if she still had water in them. But been achy the last 4 days     Here for right ear pain.  Been going on a few days.  No ear drainage, no ear swelling or redness.  Feels pressure in the ear.  No fever, no major sore throat, no NVD.   Has occasional cough.   No covid contacts.  Not typically getting colds or ear infections.   She has been swimming some but has used some alcohol drops in the ears.  No other aggravating or relieving factors.    No other c/o.  The following portions of the patient's history were reviewed and updated as appropriate: allergies, current medications, past family history, past medical history, past social history, past surgical history and problem list.  ROS Otherwise as in subjective above  Objective: BP 120/70   Pulse 92   Temp 98.3 F (36.8 C)   Wt 216 lb 12.8 oz (98.3 kg)   BMI 36.64 kg/m   General appearance: alert, no distress, well developed, well nourished HEENT: normocephalic, sclerae anicteric, conjunctiva pink and moist, right TM flat with some serous fluid behind eardrum but otherwise ear drum and ear canal normal, left TM and canal normal, nares patent, no discharge or erythema, pharynx normal Oral cavity: MMM, no lesions Neck: supple, no lymphadenopathy, no thyromegaly, no masses Heart: RRR, normal S1, S2, no murmurs Lungs: CTA bilaterally, no wheezes, rhonchi, or rales   Assessment: Encounter Diagnoses  Name Primary?   Dysfunction of right eustachian tube Yes   Right ear pain      Plan: We discussed symptoms and concerns.  No obvious swimmer's ear or otitis media.  Symptoms and exam suggest eustachian tube dysfunction.  We discussed the following recommendations.  Make sure you are drinking 80 to  100 ounces of water daily Begin a over-the-counter decongestant such as Sudafed, phenylephrine, or other nasal decongestant over-the-counter for about 5 days Swallowing also helps reduce some of the pressure in the sinuses If much worse by Friday such as worse pain, ear feels swollen, drainage after your other then call back You can use over-the-counter Tylenol or ibuprofen for pain and discomfort   [name] was seen today for right ear pain.  Diagnoses and all orders for this visit:  Dysfunction of right eustachian tube  Right ear pain   Follow up:   prn, recheck if worse or not improving over the next 4 to 5 days

## 2021-03-25 NOTE — Patient Instructions (Signed)
Eustachian Tube Dysfunction   Make sure you are drinking 80 to 100 ounces of water daily Begin a over-the-counter decongestant such as Sudafed, phenylephrine, or other nasal decongestant over-the-counter for about 5 days Swallowing also helps reduce some of the pressure in the sinuses If much worse by Friday such as worse pain, ear feels swollen, drainage after your other then call back You can use over-the-counter Tylenol or ibuprofen for pain and discomfort   Eustachian Tube Dysfunction  Eustachian tube dysfunction refers to a condition in which a blockage develops in the narrow passage that connects the middle ear to the back of the nose (eustachian tube). The eustachian tube regulates air pressure in the middle ear by letting air move between the ear and nose. It also helps to drain fluid from the middle earspace. Eustachian tube dysfunction can affect one or both ears. When the eustachian tube does not function properly, air pressure, fluid, or both can build up inthe middle ear. What are the causes? This condition occurs when the eustachian tube becomes blocked or cannot open normally. Common causes of this condition include: Ear infections. Colds and other infections that affect the nose, mouth, and throat (upper respiratory tract). Allergies. Irritation from cigarette smoke. Irritation from stomach acid coming up into the esophagus (gastroesophageal reflux). The esophagus is the tube that carries food from the mouth to the stomach. Sudden changes in air pressure, such as from descending in an airplane or scuba diving. Abnormal growths in the nose or throat, such as: Growths that line the nose (nasal polyps). Abnormal growth of cells (tumors). Enlarged tissue at the back of the throat (adenoids). What increases the risk? You are more likely to develop this condition if: You smoke. You are overweight. You are a child who has: Certain birth defects of the mouth, such as cleft  palate. Large tonsils or adenoids. What are the signs or symptoms? Common symptoms of this condition include: A feeling of fullness in the ear. Ear pain. Clicking or popping noises in the ear. Ringing in the ear. Hearing loss. Loss of balance. Dizziness. Symptoms may get worse when the air pressure around you changes, such as whenyou travel to an area of high elevation, fly on an airplane, or go scuba diving. How is this diagnosed? This condition may be diagnosed based on: Your symptoms. A physical exam of your ears, nose, and throat. Tests, such as those that measure: The movement of your eardrum (tympanogram). Your hearing (audiometry). How is this treated? Treatment depends on the cause and severity of your condition. In mild cases, you may relieve your symptoms by moving air into your ears. This is called "popping the ears." In more severe cases, or if you have symptoms of fluid in your ears, treatment may include: Medicines to relieve congestion (decongestants). Medicines that treat allergies (antihistamines). Nasal sprays or ear drops that contain medicines that reduce swelling (steroids). A procedure to drain the fluid in your eardrum (myringotomy). In this procedure, a small tube is placed in the eardrum to: Drain the fluid. Restore the air in the middle ear space. A procedure to insert a balloon device through the nose to inflate the opening of the eustachian tube (balloon dilation). Follow these instructions at home: Lifestyle Do not do any of the following until your health care provider approves: Travel to high altitudes. Fly in airplanes. Work in a Pension scheme manager or room. Scuba dive. Do not use any products that contain nicotine or tobacco, such as cigarettes and e-cigarettes.  If you need help quitting, ask your health care provider. Keep your ears dry. Wear fitted earplugs during showering and bathing. Dry your ears completely after. General instructions Take  over-the-counter and prescription medicines only as told by your health care provider. Use techniques to help pop your ears as recommended by your health care provider. These may include: Chewing gum. Yawning. Frequent, forceful swallowing. Closing your mouth, holding your nose closed, and gently blowing as if you are trying to blow air out of your nose. Keep all follow-up visits as told by your health care provider. This is important. Contact a health care provider if: Your symptoms do not go away after treatment. Your symptoms come back after treatment. You are unable to pop your ears. You have: A fever. Pain in your ear. Pain in your head or neck. Fluid draining from your ear. Your hearing suddenly changes. You become very dizzy. You lose your balance. Summary Eustachian tube dysfunction refers to a condition in which a blockage develops in the eustachian tube. It can be caused by ear infections, allergies, inhaled irritants, or abnormal growths in the nose or throat. Symptoms include ear pain, hearing loss, or ringing in the ears. Mild cases are treated with maneuvers to unblock the ears, such as yawning or ear popping. Severe cases are treated with medicines. Surgery may also be done (rare). This information is not intended to replace advice given to you by your health care provider. Make sure you discuss any questions you have with your healthcare provider. Document Revised: 11/08/2017 Document Reviewed: 11/08/2017 Elsevier Patient Education  [place].

## 2021-04-22 ENCOUNTER — Ambulatory Visit: Payer: 59 | Admitting: Family Medicine

## 2021-04-22 ENCOUNTER — Encounter: Payer: Self-pay | Admitting: Family Medicine

## 2021-04-22 ENCOUNTER — Other Ambulatory Visit: Payer: Self-pay

## 2021-04-22 VITALS — BP 130/82 | HR 102 | Temp 98.8°F | Ht 65.0 in | Wt 216.0 lb

## 2021-04-22 DIAGNOSIS — Z87898 Personal history of other specified conditions: Secondary | ICD-10-CM | POA: Diagnosis not present

## 2021-04-22 DIAGNOSIS — D500 Iron deficiency anemia secondary to blood loss (chronic): Secondary | ICD-10-CM

## 2021-04-22 DIAGNOSIS — R519 Headache, unspecified: Secondary | ICD-10-CM

## 2021-04-22 DIAGNOSIS — N921 Excessive and frequent menstruation with irregular cycle: Secondary | ICD-10-CM | POA: Diagnosis not present

## 2021-04-22 NOTE — Patient Instructions (Addendum)
You will receive a call from [facility] regarding your MRI.  I recommend taking 1000 mg of Tylenol and then alternating with 800 mg of ibuprofen.  Make sure you are staying well-hydrated.  If you develop any new symptoms such as blurred or double vision, dizziness, confusion, vomiting, then you should call 911 or go to the emergency department.  Call tomorrow and let me know how you are doing.

## 2021-04-22 NOTE — Progress Notes (Signed)
Subjective:    Patient ID: Kristina Leon, female    DOB: 1986-04-28, 35 y.o.   MRN: [id number]  HPI Chief Complaint  Patient presents with   Migraine    Started Monday night consistently and worsening, has had in past before period but this time is different. Tylenol and motrin is not effective   Complains of a 2 day history of bilateral parietal headache that feels like pressure. States this headache is different from her usual pre-menstrual headaches but she did start spotting again and thinks she may be starting her period for the second time this month.  States she cannot sleep due to the pain. Pain is non pulsatile, not throbbing. No blurred or double vision.   LMP: 04/06/2021  States she took ibuprofen, tylenol. Her last dose of pain medication was last night.   States she is concerned her hgb is low because she had a heavy period this month. Treated by OB/GYN for anemia related to fibroids and heavy periods.   No fever, chills, dizziness, vision changes, neck pain, chest pain, palpitations, shortness of breath, abdominal pain, N/V/D.   States she is not sexually active, her husband is not in the country.   Reviewed allergies, medications, past medical, surgical, family, and social history.   Review of Systems Pertinent positives and negatives in the history of present illness.     Objective:   Physical Exam Constitutional:      General: She is not in acute distress.    Appearance: Normal appearance. She is not ill-appearing.  Eyes:     Extraocular Movements: Extraocular movements intact.     Conjunctiva/sclera: Conjunctivae normal.     Pupils: Pupils are equal, round, and reactive to light.  Cardiovascular:     Rate and Rhythm: Normal rate and regular rhythm.     Pulses: Normal pulses.     Heart sounds: Normal heart sounds.  Pulmonary:     Effort: Pulmonary effort is normal.     Breath sounds: Normal breath sounds.  Musculoskeletal:        General: Normal range  of motion.     Cervical back: Normal range of motion and neck supple.  Lymphadenopathy:     Cervical: No cervical adenopathy.  Skin:    General: Skin is warm and dry.  Neurological:     General: No focal deficit present.     Mental Status: She is alert and oriented to person, place, and time.     Cranial Nerves: Cranial nerves are intact. No facial asymmetry.     Sensory: Sensation is intact.     Motor: Motor function is intact. No weakness or pronator drift.     Gait: Gait is intact.  Psychiatric:        Attention and Perception: Attention normal.        Mood and Affect: Mood and affect normal.        Speech: Speech normal.        Behavior: Behavior normal.        Cognition and Memory: Cognition normal.   BP 130/82 (BP Location: Left Arm, Patient Position: Sitting)   Pulse (!) 102   Temp 98.8 F (37.1 C) (Oral)   Ht 5\' 5"  (1.651 m)   Wt 216 lb (98 kg)   LMP 03/04/2021 Comment: fells like is starting again- is having spotting  SpO2 97%   BMI 35.94 kg/m        Assessment & Plan:  Worst headache of life -  Plan: CBC with Differential/Platelet, Comprehensive metabolic panel, TSH, MR Brain W Wo Contrast  History of headache  Menorrhagia with irregular cycle - Plan: CBC with Differential/Platelet, Comprehensive metabolic panel, TSH, Iron  Iron deficiency anemia due to chronic blood loss - Plan: CBC with Differential/Platelet, Comprehensive metabolic panel, TSH, Iron  Discussed that this headache may or may not be related to a menstrual cycle since she has not actually started her cycle. This headache is described as the worst one of her life but she is neurologically intact and no other red flag symptoms. Does not appear to be migrainous. I will order a MRI to look for any underlying etiology. Recommend alternating Tylenol 1000 mg bid and Ibuprofen 800 mg TID. Increase water intake.  She will let me know how she is doing tomorrow. Advised her to go to the ED or call 911 if  symptoms worsen.  Check labs to look for worsening anemia per patient request. Follow up with OB/GYN.

## 2021-04-23 LAB — CBC WITH DIFFERENTIAL/PLATELET
Basophils Absolute: 0 10*3/uL (ref 0.0–0.2)
Basos: 1 %
EOS (ABSOLUTE): 0.3 10*3/uL (ref 0.0–0.4)
Eos: 4 %
Hematocrit: 34.8 % (ref 34.0–46.6)
Hemoglobin: 10.8 g/dL — ABNORMAL LOW (ref 11.1–15.9)
Immature Grans (Abs): 0 10*3/uL (ref 0.0–0.1)
Immature Granulocytes: 0 %
Lymphocytes Absolute: 2 10*3/uL (ref 0.7–3.1)
Lymphs: 30 %
MCH: 25.4 pg — ABNORMAL LOW (ref 26.6–33.0)
MCHC: 31 g/dL — ABNORMAL LOW (ref 31.5–35.7)
MCV: 82 fL (ref 79–97)
Monocytes Absolute: 0.6 10*3/uL (ref 0.1–0.9)
Monocytes: 9 %
Neutrophils Absolute: 3.7 10*3/uL (ref 1.4–7.0)
Neutrophils: 56 %
Platelets: 404 10*3/uL (ref 150–450)
RBC: 4.26 x10E6/uL (ref 3.77–5.28)
RDW: 16.3 % — ABNORMAL HIGH (ref 11.7–15.4)
WBC: 6.6 10*3/uL (ref 3.4–10.8)

## 2021-04-23 LAB — COMPREHENSIVE METABOLIC PANEL
ALT: 5 IU/L (ref 0–32)
AST: 13 IU/L (ref 0–40)
Albumin/Globulin Ratio: 1.4 (ref 1.2–2.2)
Albumin: 4 g/dL (ref 3.8–4.8)
Alkaline Phosphatase: 64 IU/L (ref 44–121)
BUN/Creatinine Ratio: 13 (ref 9–23)
BUN: 9 mg/dL (ref 6–20)
Bilirubin Total: 0.2 mg/dL (ref 0.0–1.2)
CO2: 23 mmol/L (ref 20–29)
Calcium: 9.2 mg/dL (ref 8.7–10.2)
Chloride: 100 mmol/L (ref 96–106)
Creatinine, Ser: 0.68 mg/dL (ref 0.57–1.00)
Globulin, Total: 2.9 g/dL (ref 1.5–4.5)
Glucose: 107 mg/dL — ABNORMAL HIGH (ref 65–99)
Potassium: 3.9 mmol/L (ref 3.5–5.2)
Sodium: 141 mmol/L (ref 134–144)
Total Protein: 6.9 g/dL (ref 6.0–8.5)
eGFR: 116 mL/min/{1.73_m2} (ref >59)

## 2021-04-23 LAB — TSH: TSH: 5.63 u[IU]/mL — ABNORMAL HIGH (ref 0.450–4.500)

## 2021-04-23 LAB — IRON: Iron: 30 ug/dL (ref 27–159)

## 2021-04-23 NOTE — Progress Notes (Signed)
Please check in with her and see how she is feeling today.

## 2021-04-27 ENCOUNTER — Other Ambulatory Visit (INDEPENDENT_AMBULATORY_CARE_PROVIDER_SITE_OTHER): Payer: 59

## 2021-04-27 ENCOUNTER — Telehealth: Payer: Self-pay | Admitting: *Deleted

## 2021-04-27 ENCOUNTER — Telehealth: Payer: Self-pay

## 2021-04-27 ENCOUNTER — Other Ambulatory Visit: Payer: Self-pay

## 2021-04-27 DIAGNOSIS — Z23 Encounter for immunization: Secondary | ICD-10-CM | POA: Diagnosis not present

## 2021-04-27 NOTE — Telephone Encounter (Signed)
Pt. Called stating she stepped on a nail about 1 mouth ago and put some cream on it but now her foot and leg have a funny feeling now. She would like to get a tetanus shot has not had one in a long time.

## 2021-04-27 NOTE — Telephone Encounter (Signed)
Patient was here for Tdap, scheduled her for TSH in 5 weeks. Need future order please, thanks.

## 2021-05-13 ENCOUNTER — Other Ambulatory Visit: Payer: Self-pay

## 2021-05-13 MED ORDER — NORELGESTROMIN-ETH ESTRADIOL 150-35 MCG/24HR TD PTWK: 1.0000 | MEDICATED_PATCH | TRANSDERMAL | 2 refills | Status: DC

## 2021-05-13 NOTE — Telephone Encounter (Signed)
AEX 01/07/21 with TW.

## 2021-06-01 ENCOUNTER — Other Ambulatory Visit (INDEPENDENT_AMBULATORY_CARE_PROVIDER_SITE_OTHER): Payer: 59

## 2021-06-01 ENCOUNTER — Other Ambulatory Visit: Payer: Self-pay

## 2021-06-01 DIAGNOSIS — Z23 Encounter for immunization: Secondary | ICD-10-CM

## 2021-06-01 DIAGNOSIS — Z1329 Encounter for screening for other suspected endocrine disorder: Secondary | ICD-10-CM

## 2021-06-02 ENCOUNTER — Other Ambulatory Visit: Payer: Self-pay | Admitting: Medical

## 2021-06-02 LAB — TSH+FREE T4
Free T4: 1.06 ng/dL (ref 0.82–1.77)
TSH: 7.42 u[IU]/mL — ABNORMAL HIGH (ref 0.450–4.500)

## 2021-07-20 ENCOUNTER — Ambulatory Visit: Payer: 59 | Admitting: Family Medicine

## 2021-07-20 ENCOUNTER — Other Ambulatory Visit: Payer: Self-pay

## 2021-07-20 ENCOUNTER — Encounter: Payer: Self-pay | Admitting: Family Medicine

## 2021-07-20 VITALS — BP 110/70 | HR 100 | Temp 98.4°F | Ht 65.0 in | Wt 225.4 lb

## 2021-07-20 DIAGNOSIS — D509 Iron deficiency anemia, unspecified: Secondary | ICD-10-CM | POA: Diagnosis not present

## 2021-07-20 DIAGNOSIS — M542 Cervicalgia: Secondary | ICD-10-CM | POA: Diagnosis not present

## 2021-07-20 DIAGNOSIS — E039 Hypothyroidism, unspecified: Secondary | ICD-10-CM | POA: Diagnosis not present

## 2021-07-20 DIAGNOSIS — R519 Headache, unspecified: Secondary | ICD-10-CM

## 2021-07-20 LAB — POC COVID19 BINAXNOW: SARS Coronavirus 2 Ag: NEGATIVE

## 2021-07-20 NOTE — Patient Instructions (Addendum)
°  You may want to look at your work setup-as far as seat/workstation height. If you aren't able to make it a more neck-neutral positioning, please try and take frequent short breaks for neck stretches.  There seems to be a muscular component to your neck pain/headache. Try heat (we discussed Thermacare patch) and doing the stretches as shown at least twice daily (chin down, ear to shoulder and looking over your shoulder, in both directions).  You may continue to use ibuprofen, but be sure to take it with food. You may take up to 3-4 tablets at one time, with food, three times daily (max of 12 pills/day). If it bothers your stomach, cut back on the dose. Consider taking pepcid up to twice daily if having any upper stomach pain or heartburn. Take the ibuprofen around the clock for the next few days until your headache and neck tenderness are better.  We are checking your thyroid tests and anemia studies again today.

## 2021-07-20 NOTE — Progress Notes (Signed)
Chief Complaint  Patient presents with   Headache    Headache x 2 days in the back of her head. No other symptoms.    She works 3rd shift, left early from work of Friday. She didn't feel well, whole body was aching. Works as a Editor, commissioning, looks down a lot. Denied any fever.  Saturday, while trying to wake up she noted feeling dizzy/lightheaded, went back to sleep.  Didn't go to work Saturday. She had started with a headache.  This started Saturday night (2 nights ago), and was across the crown of her head. Yesterday, headache was somewhat better, was able to go to work last night. Ibuprofen has helped with her headaches. HA recurred again this morning.  No associated nausea, vomiting, aura, no light or sound sensitivity. Took ibuprofen this morning, and feels better. Last dose was 4am.  Denies sick contacts at home or at work. She had COVID 08/2019. She hasn't had any COVID vaccines. She did get her flu shot this year.  Last saw Vickie 04/22/21 with severe headache--bilateral parietal HA, different from premenstrual HAs. Labs were checked--elevated TSH noted, recheck remained elevated (normal last year). Normal nonfasting c-met.  CBC notable for Hgb 10.8, o/w normal.  MRI was ordered, never done. (Guessing an insurance issue?) She never heard back from anyone. Headache resolved about 3 days after visit here, so didn't pursue the MRI.  Denies any changes to her hair/skin/nails. She has cold hands/feet related to anemia.  No constipation. No change in energy. +weight gain. +9# weight gain since last visit. Doesn't exercise, but no changes in diet, wasn't exercising prior.    PMH, PSH, SH reviewed  Outpatient Encounter Medications as of 07/20/2021  Medication Sig Note   ibuprofen (ADVIL) 200 MG tablet Take 400 mg by mouth every 6 (six) hours as needed. 07/20/2021: Last dose 4am   norelgestromin-ethinyl estradiol (ORTHO EVRA) 150-35 MCG/24HR transdermal patch Place 1 patch onto the skin  once a week. Start the first Sunday after your period begins    [DISCONTINUED] megestrol (MEGACE) 40 MG tablet Take 1 tablet (40 mg total) by mouth 2 (two) times daily.    No facility-administered encounter medications on file as of 07/20/2021.   She takes iron (OTC) once daily.  Allergies  Allergen Reactions   Pork-Derived Products     Pt is ARABIC  WANTS NO PORK  Or gellatin    ROSS: no fever, chills, URI symptoms.  +HA per HPI.  No n/v/d. Some heartburn recently No neuro symptoms--numbness, tingling, weakness, vertigo. No bleeding, bruising, rash. See HPI   PHYSICAL EXAM:  BP 110/70    Pulse 100    Temp 98.4 F (36.9 C) (Tympanic)    Ht $R'5\' 5"'cB$  (1.651 m)    Wt 225 lb 6.4 oz (102.2 kg)    LMP 06/29/2021 (Approximate)    BMI 37.51 kg/m   Wt Readings from Last 3 Encounters:  07/20/21 225 lb 6.4 oz (102.2 kg)  04/22/21 216 lb (98 kg)  03/25/21 216 lb 12.8 oz (98.3 kg)   Well-appearing, pleasant female in no distress HEENT: conjunctiva and sclera are clear, EOMI.  Scalp/head nontender; nontender over crown where she describes pain. Neck: FROM of neck. Nontender over C-spine Tender over paraspinous muscles in neck bilaterally, as well as trapezius muscles bilaterally. No lymphadenopathy or thyromegaly Heart: regular rate and rhythm Lungs: clear bilaterally Abdomen: soft, nontender, obese Extremities: no edema Neuro: alert and oriented. Normal strength, DTR's, gait. Psych: normal mood, affect, hygiene and grooming  ASSESSMENT/PLAN:  Acute nonintractable headache, unspecified headache type - suspect muscular component. Will test for COVID. Heat, stretches, massage, proper NSAID dosing reviewed.  - Plan: POC COVID-19, Novel Coronavirus, NAA ([facility])  Hypothyroidism, unspecified type - fatigue and weight gain.  Will recheck since pt is here, to determine if any treatment is warranted at this point. - Plan: TSH, Thyroid Peroxidase Antibodies (TPO) ([facility])  Iron deficiency anemia,  unspecified iron deficiency anemia type - Plan: CBC with Differential/Platelet, Ferritin  Neck pain, bilateral posterior - muscular.  Reviewed posture, stretches, NSAIDs, heat prn - Plan: POC COVID-19, Novel Coronavirus, NAA ([facility])   TSH, antithyroid antibody (TPO) Cbc, ferritin  You may want to look at your work setup-as far as seat/workstation height. If you aren't able to make it a more neck-neutral positioning, please try and take frequent short breaks for neck stretches.  There seems to be a muscular component to your neck pain/headache. Try heat (we discussed Thermacare patch) and doing the stretches as shown at least twice daily (chin down, ear to shoulder and looking over your shoulder, in both directions).  You may continue to use ibuprofen, but be sure to take it with food. Consider taking pepcid up to twice daily if having any upper stomach pain or heartburn.  We are checking your thyroid tests and anemia studies again today.

## 2021-07-21 LAB — NOVEL CORONAVIRUS, NAA: SARS-CoV-2, NAA: NOT DETECTED

## 2021-07-21 LAB — CBC WITH DIFFERENTIAL/PLATELET
Basophils Absolute: 0 10*3/uL (ref 0.0–0.2)
Basos: 1 %
EOS (ABSOLUTE): 0.3 10*3/uL (ref 0.0–0.4)
Eos: 4 %
Hematocrit: 32.9 % — ABNORMAL LOW (ref 34.0–46.6)
Hemoglobin: 10.2 g/dL — ABNORMAL LOW (ref 11.1–15.9)
Immature Grans (Abs): 0 10*3/uL (ref 0.0–0.1)
Immature Granulocytes: 0 %
Lymphocytes Absolute: 1.9 10*3/uL (ref 0.7–3.1)
Lymphs: 28 %
MCH: 23.6 pg — ABNORMAL LOW (ref 26.6–33.0)
MCHC: 31 g/dL — ABNORMAL LOW (ref 31.5–35.7)
MCV: 76 fL — ABNORMAL LOW (ref 79–97)
Monocytes Absolute: 0.7 10*3/uL (ref 0.1–0.9)
Monocytes: 10 %
Neutrophils Absolute: 3.9 10*3/uL (ref 1.4–7.0)
Neutrophils: 57 %
Platelets: 457 10*3/uL — ABNORMAL HIGH (ref 150–450)
RBC: 4.32 x10E6/uL (ref 3.77–5.28)
RDW: 13.6 % (ref 11.7–15.4)
WBC: 6.8 10*3/uL (ref 3.4–10.8)

## 2021-07-21 LAB — TSH: TSH: 8.39 u[IU]/mL — ABNORMAL HIGH (ref 0.450–4.500)

## 2021-07-21 LAB — SARS-COV-2, NAA 2 DAY TAT

## 2021-07-21 LAB — FERRITIN: Ferritin: 17 ng/mL (ref 15–150)

## 2021-07-21 LAB — THYROID PEROXIDASE ANTIBODY: Thyroperoxidase Ab SerPl-aCnc: <9 IU/mL (ref 0–34)

## 2021-07-22 ENCOUNTER — Other Ambulatory Visit: Payer: Self-pay

## 2021-07-22 ENCOUNTER — Encounter: Payer: Self-pay | Admitting: Obstetrics and Gynecology

## 2021-07-22 ENCOUNTER — Ambulatory Visit: Payer: 59 | Admitting: Obstetrics and Gynecology

## 2021-07-22 VITALS — BP 120/72 | HR 96 | Ht 65.0 in | Wt 223.0 lb

## 2021-07-22 DIAGNOSIS — N760 Acute vaginitis: Secondary | ICD-10-CM | POA: Diagnosis not present

## 2021-07-22 LAB — WET PREP FOR TRICH, YEAST, CLUE

## 2021-07-22 NOTE — Progress Notes (Signed)
GYNECOLOGY  VISIT   HPI: 35 y.o.   Single Black or African American Not Hispanic or Latino  female   [id number] with Patient's last menstrual period was 06/29/2021 (approximate).   here for abnormal discharge that started 3-4 days ago. She states that it has an odor.  No itching, burning or irritation.  Not sexually active. Engaged.  GYNECOLOGIC HISTORY: Patient's last menstrual period was 06/29/2021 (approximate). Contraception:ortho evra patch for cycle control Menopausal hormone therapy: none         OB History     Gravida  0   Para  0   Term  0   Preterm  0   AB  0   Living  0      SAB  0   IAB  0   Ectopic  0   Multiple  0   Live Births                 Patient Active Problem List   Diagnosis Date Noted   Menorrhagia with irregular cycle 05/02/2020   Iron deficiency anemia due to chronic blood loss 05/02/2020   Obesity (BMI 30-39.9) 05/02/2020   Acute kidney injury ([place]) 10/26/2016    Past Medical History:  Diagnosis Date   Headache    no aura   Iron deficiency anemia    Kidney stone    2009    Past Surgical History:  Procedure Laterality Date   BREAST SURGERY Right    cyst removed   CYSTECTOMY     BREAST   MYOMECTOMY N/A 01/06/2016   Procedure: MYOMECTOMY-abdominal;  Surgeon: Anastasio Auerbach, MD;  Location: [facility];  Service: Gynecology;  Laterality: N/A;    Current Outpatient Medications  Medication Sig Dispense Refill   ibuprofen (ADVIL) 200 MG tablet Take 400 mg by mouth every 6 (six) hours as needed.     norelgestromin-ethinyl estradiol (ORTHO EVRA) 150-35 MCG/24HR transdermal patch Place 1 patch onto the skin once a week. Start the first Sunday after your period begins 12 patch 2   No current facility-administered medications for this visit.     ALLERGIES: Pork-derived products  Family History  Problem Relation Age of Onset   Anemia Mother    Anemia Sister    Breast cancer Maternal Aunt 27    Social History    Socioeconomic History   Marital status: Single    Spouse name: Not on file   Number of children: Not on file   Years of education: Not on file   Highest education level: Not on file  Occupational History   Not on file  Tobacco Use   Smoking status: Never   Smokeless tobacco: Never  Vaping Use   Vaping Use: Never used  Substance and Sexual Activity   Alcohol use: No    Alcohol/week: 0.0 standard drinks   Drug use: No   Sexual activity: Never    Comment: Virgin  Other Topics Concern   Not on file  Social History Narrative   Lives in [place], lives with parents and several sisters and brothers. From [place] originally, then lived in [place]. Speaks English well.    Social Determinants of Health   Financial Resource Strain: Not on file  Food Insecurity: Not on file  Transportation Needs: Not on file  Physical Activity: Not on file  Stress: Not on file  Social Connections: Not on file  Intimate Partner Violence: Not on file    Review of Systems  Genitourinary:  Vaginal discharge  Vaginal odor   All other systems reviewed and are negative.  PHYSICAL EXAMINATION:    BP 120/72    Pulse 96    Ht 5\' 5"  (1.651 m)    Wt 223 lb (101.2 kg)    LMP 06/29/2021 (Approximate)    SpO2 100%    BMI 37.11 kg/m     General appearance: alert, cooperative and appears stated age  Pelvic: External genitalia:  no lesions              Urethra:  normal appearing urethra with no masses, tenderness or lesions              Bartholins and Skenes: normal                 Vagina: normal appearing vagina with slight watery/blood tinged vaginal discharge              Cervix:  not seen, patient uncomfortable with speculum exam               Chaperone was present for exam.  1. Acute vaginitis - WET PREP FOR TRICH, YEAST, CLUE: negative - SureSwab Advanced Vaginitis, TMA

## 2021-07-22 NOTE — Patient Instructions (Signed)

## 2021-07-23 LAB — SURESWAB® ADVANCED VAGINITIS,TMA
CANDIDA SPECIES: NOT DETECTED
Candida glabrata: NOT DETECTED
SURESWAB(R) ADV BACTERIAL VAGINOSIS(BV),TMA: NEGATIVE
TRICHOMONAS VAGINALIS (TV),TMA: NOT DETECTED

## 2021-09-30 ENCOUNTER — Other Ambulatory Visit: Payer: Self-pay

## 2021-09-30 ENCOUNTER — Ambulatory Visit: Payer: 59 | Admitting: Medical

## 2021-09-30 VITALS — BP 124/74 | HR 85 | Wt 219.8 lb

## 2021-09-30 DIAGNOSIS — E611 Iron deficiency: Secondary | ICD-10-CM | POA: Diagnosis not present

## 2021-09-30 DIAGNOSIS — N920 Excessive and frequent menstruation with regular cycle: Secondary | ICD-10-CM

## 2021-09-30 DIAGNOSIS — D259 Leiomyoma of uterus, unspecified: Secondary | ICD-10-CM | POA: Diagnosis not present

## 2021-09-30 DIAGNOSIS — R7989 Other specified abnormal findings of blood chemistry: Secondary | ICD-10-CM

## 2021-09-30 HISTORY — DX: Iron deficiency: E61.1

## 2021-09-30 NOTE — Progress Notes (Signed)
Subjective: ? Kristina Leon is a 36 y.o. female who presents for ?Chief Complaint  ?Patient presents with  ? 4 month follow-up  ?  4 month follow-up on thyroid  ?   ?Here for recheck on anemia and thyroid.  Has been anemic since high school.  Mom and sister also has hx/o anemia.  No prior blood transfusion.  No current bleeding or bruising.  No blood in stool.  No blood in urine. Periods are heavy, has hx/o fibroids.  Sees gynecology, [facility].  Planning to get married soon, so is waiting for now on any other treatment in regards to bleeding.  She does have prior history of fibroid surgery but more fibroids grew back ? ?Prior to December was on supplement biotin to help with hair loss.  This was discontinued in December in case this is affecting the thyroid ? ?No family history of thyroid disorder.  No family history of colon cancer. ? ?No blood in stool, no prior colonoscopy. ? ?Otherwise normal state of health ? ?No other aggravating or relieving factors.   ? ?No other c/o. ? ?Past Medical History:  ?Diagnosis Date  ? Headache   ? no aura  ? Iron deficiency anemia   ? Kidney stone   ? 2009  ? ?Current Outpatient Medications on File Prior to Visit  ?Medication Sig Dispense Refill  ? Ferrous Sulfate (IRON PO) Take by mouth.    ? ibuprofen (ADVIL) 200 MG tablet Take 400 mg by mouth every 6 (six) hours as needed.    ? norelgestromin-ethinyl estradiol (ORTHO EVRA) 150-35 MCG/24HR transdermal patch Place 1 patch onto the skin once a week. Start the first Sunday after your period begins 12 patch 2  ? ?No current facility-administered medications on file prior to visit.  ? ?Past Surgical History:  ?Procedure Laterality Date  ? BREAST SURGERY Right   ? cyst removed  ? CYSTECTOMY    ? BREAST  ? MYOMECTOMY N/A 01/06/2016  ? Procedure: MYOMECTOMY-abdominal;  Surgeon: Anastasio Auerbach, MD;  Location: [facility];  Service: Gynecology;  Laterality: N/A;  ? ? ? ?The following portions of the patient's history were  reviewed and updated as appropriate: allergies, current medications, past family history, past medical history, past social history, past surgical history and problem list. ? ?ROS ?Otherwise as in subjective above ? ?Objective: ?BP 124/74   Pulse 85   Wt 219 lb 12.8 oz (99.7 kg)   BMI 36.58 kg/m?  ? ?Wt Readings from Last 3 Encounters:  ?09/30/21 219 lb 12.8 oz (99.7 kg)  ?07/22/21 223 lb (101.2 kg)  ?07/20/21 225 lb 6.4 oz (102.2 kg)  ? ? ?General appearance: alert, no distress, well developed, well nourished ?Neck: supple, no lymphadenopathy, no thyromegaly, no masses ?Heart: RRR, normal S1, S2, no murmurs ?Lungs: CTA bilaterally, no wheezes, rhonchi, or rales ?Abdomen: +bs, soft, non tender, non distended, no masses, no hepatomegaly, no splenomegaly ?Pulses: 2+ radial pulses, 2+ pedal pulses, normal cap refill ?Ext: no edema ? ? ?Assessment: ?Encounter Diagnoses  ?Name Primary?  ? Abnormal thyroid blood test Yes  ? Iron deficiency   ? Uterine leiomyoma, unspecified location   ? Menorrhagia with regular cycle   ? ? ? ?Plan: ?abnormal thyroid labs in past few months.   Recheck labs today.   She doesn't have much in the way of thyroid symptoms.  She was on biotin until 07/2021 and stopped this.    ? ?Iron deficiency anemia - she continues on iron therapy  daily, BID during periods.  Hx/o uterine fibroids, heavy periods . Sees gynecology.  Has history of prior fibroid surgery as well.   Updated labs today ? ?Irja was seen today for 4 month follow-up. ? ?Diagnoses and all orders for this visit: ? ?Abnormal thyroid blood test ?-     TSH + free T4 ? ?Iron deficiency ?-     CBC ?-     Iron, TIBC and Ferritin Panel ? ?Uterine leiomyoma, unspecified location ? ?Menorrhagia with regular cycle ? ? ? ?Follow up: pending labs ?

## 2021-10-01 ENCOUNTER — Other Ambulatory Visit: Payer: Self-pay | Admitting: Medical

## 2021-10-01 LAB — CBC
Hematocrit: 31.2 % — ABNORMAL LOW (ref 34.0–46.6)
Hemoglobin: 9.3 g/dL — ABNORMAL LOW (ref 11.1–15.9)
MCH: 21.7 pg — ABNORMAL LOW (ref 26.6–33.0)
MCHC: 29.8 g/dL — ABNORMAL LOW (ref 31.5–35.7)
MCV: 73 fL — ABNORMAL LOW (ref 79–97)
Platelets: 438 10*3/uL (ref 150–450)
RBC: 4.28 x10E6/uL (ref 3.77–5.28)
RDW: 16.7 % — ABNORMAL HIGH (ref 11.7–15.4)
WBC: 7.3 10*3/uL (ref 3.4–10.8)

## 2021-10-01 LAB — IRON,TIBC AND FERRITIN PANEL
Ferritin: 21 ng/mL (ref 15–150)
Iron Saturation: 4 % — CL (ref 15–55)
Iron: 16 ug/dL — ABNORMAL LOW (ref 27–159)
Total Iron Binding Capacity: 378 ug/dL (ref 250–450)
UIBC: 362 ug/dL (ref 131–425)

## 2021-10-01 LAB — TSH+FREE T4
Free T4: 1.21 ng/dL (ref 0.82–1.77)
TSH: 5.83 u[IU]/mL — ABNORMAL HIGH (ref 0.450–4.500)

## 2021-10-01 MED ORDER — TANDEM 162-115.2 MG PO CAPS: 1.0000 | ORAL_CAPSULE | Freq: Every day | ORAL | 1 refills | Status: DC

## 2021-10-06 ENCOUNTER — Telehealth: Payer: Self-pay

## 2021-10-06 ENCOUNTER — Encounter: Payer: Self-pay | Admitting: Internal Medicine

## 2021-10-06 ENCOUNTER — Other Ambulatory Visit: Payer: Self-pay

## 2021-10-06 NOTE — Telephone Encounter (Signed)
I have filled out form to get iron infusion done. I see the med was sent in the other day.  ?

## 2021-10-06 NOTE — Telephone Encounter (Signed)
Pt med was not sent to the pharmacy. Please advise [place] ?

## 2021-10-06 NOTE — Telephone Encounter (Signed)
Pt was advised of labs and wants to go head with the infusion. Pt will be leaving the country around the 18 th of this month and would like something scheduled soon if  we can. If we can not she will be returning on the 28 th of march.  ?Elyse Jarvis RMA ? ?

## 2021-10-09 ENCOUNTER — Non-Acute Institutional Stay (HOSPITAL_COMMUNITY)
Admission: RE | Admit: 2021-10-09 | Discharge: 2021-10-09 | Disposition: A | Discharge status: Home / Self Care | Payer: 59 | Source: Ambulatory Visit | Attending: Internal Medicine | Admitting: Internal Medicine

## 2021-10-09 ENCOUNTER — Other Ambulatory Visit: Payer: Self-pay

## 2021-10-09 DIAGNOSIS — E611 Iron deficiency: Secondary | ICD-10-CM | POA: Diagnosis present

## 2021-10-09 MED ORDER — SODIUM CHLORIDE 0.9 % IV SOLN: INTRAVENOUS | Status: DC | PRN

## 2021-10-09 MED ORDER — SODIUM CHLORIDE 0.9 % IV SOLN
510.0000 mg | Freq: Once | INTRAVENOUS | Status: AC
  Administered 2021-10-09: 510 mg via INTRAVENOUS
  Filled 2021-10-09: qty 17

## 2021-10-09 NOTE — Progress Notes (Signed)
[facility] NOTE: ? ?Diagnosis: iron deficiency  ? ? ?Provider: Dorothea Ogle PA ? ? ?Procedure: Feraheme '510mg'$  ? ? ?Patient received IV  Feraheme ( dose 1 of 1). No premeds required per orders. Observed for at least 30 minutes post infusion. Tolerated well, vitals stable, discharge instructions given , verbalized understanding. Patient alert, oriented, and ambulatory at the time of discharge.   ?

## 2021-10-13 ENCOUNTER — Other Ambulatory Visit: Payer: Self-pay

## 2021-10-13 MED ORDER — TANDEM 162-115.2 MG PO CAPS: 1.0000 | ORAL_CAPSULE | Freq: Every day | ORAL | 1 refills | Status: DC

## 2021-10-29 ENCOUNTER — Other Ambulatory Visit: Payer: 59

## 2021-10-29 ENCOUNTER — Telehealth: Payer: Self-pay

## 2021-10-29 ENCOUNTER — Other Ambulatory Visit: Payer: Self-pay | Admitting: Medical

## 2021-10-29 DIAGNOSIS — D500 Iron deficiency anemia secondary to blood loss (chronic): Secondary | ICD-10-CM

## 2021-10-29 NOTE — Telephone Encounter (Signed)
Pt coming in today for repeat labs, she had infusion a couple weeks ago, please put in orders

## 2021-10-30 ENCOUNTER — Other Ambulatory Visit: Payer: Self-pay | Admitting: Medical

## 2021-10-30 ENCOUNTER — Other Ambulatory Visit: Payer: Self-pay | Admitting: Internal Medicine

## 2021-10-30 DIAGNOSIS — D500 Iron deficiency anemia secondary to blood loss (chronic): Secondary | ICD-10-CM

## 2021-10-30 DIAGNOSIS — Z1211 Encounter for screening for malignant neoplasm of colon: Secondary | ICD-10-CM

## 2021-10-30 LAB — CBC
Hematocrit: 35.8 % (ref 34.0–46.6)
Hemoglobin: 11 g/dL — ABNORMAL LOW (ref 11.1–15.9)
MCH: 23.8 pg — ABNORMAL LOW (ref 26.6–33.0)
MCHC: 30.7 g/dL — ABNORMAL LOW (ref 31.5–35.7)
MCV: 77 fL — ABNORMAL LOW (ref 79–97)
Platelets: 463 10*3/uL — ABNORMAL HIGH (ref 150–450)
RBC: 4.63 x10E6/uL (ref 3.77–5.28)
RDW: 21.6 % — ABNORMAL HIGH (ref 11.7–15.4)
WBC: 6.7 10*3/uL (ref 3.4–10.8)

## 2021-10-30 LAB — IRON: Iron: 35 ug/dL (ref 27–159)

## 2021-10-30 NOTE — Telephone Encounter (Signed)
done

## 2021-11-17 LAB — FECAL OCCULT BLOOD, IMMUNOCHEMICAL: Fecal Occult Bld: NEGATIVE

## 2021-12-09 ENCOUNTER — Other Ambulatory Visit: Payer: 59

## 2021-12-09 DIAGNOSIS — D500 Iron deficiency anemia secondary to blood loss (chronic): Secondary | ICD-10-CM

## 2021-12-10 ENCOUNTER — Ambulatory Visit: Payer: 59 | Admitting: Medical

## 2021-12-10 VITALS — BP 104/66 | HR 104 | Temp 98.6°F

## 2021-12-10 DIAGNOSIS — N920 Excessive and frequent menstruation with regular cycle: Secondary | ICD-10-CM

## 2021-12-10 DIAGNOSIS — D500 Iron deficiency anemia secondary to blood loss (chronic): Secondary | ICD-10-CM

## 2021-12-10 LAB — CBC
Hematocrit: 25.1 % — ABNORMAL LOW (ref 34.0–46.6)
Hemoglobin: 7.6 g/dL — ABNORMAL LOW (ref 11.1–15.9)
MCH: 22.3 pg — ABNORMAL LOW (ref 26.6–33.0)
MCHC: 30.3 g/dL — ABNORMAL LOW (ref 31.5–35.7)
MCV: 74 fL — ABNORMAL LOW (ref 79–97)
Platelets: 408 10*3/uL (ref 150–450)
RBC: 3.41 x10E6/uL — ABNORMAL LOW (ref 3.77–5.28)
RDW: 19.1 % — ABNORMAL HIGH (ref 11.7–15.4)
WBC: 6.7 10*3/uL (ref 3.4–10.8)

## 2021-12-10 LAB — IRON: Iron: 13 ug/dL — ABNORMAL LOW (ref 27–159)

## 2021-12-10 MED ORDER — TANDEM PLUS 162-115.2-1 MG PO CAPS: 1.0000 | ORAL_CAPSULE | Freq: Two times a day (BID) | ORAL | 1 refills | Status: DC

## 2021-12-10 NOTE — Progress Notes (Signed)
Subjective: ? Kristina Leon is a 36 y.o. female who presents for ?Chief Complaint  ?Patient presents with  ? discuss low iron  ?  Discuss low iron. Iron infusion was march 10th. Last iron tablet was yesterday after breakfast. Hasn't taken today yet. Just got off heavy cycle 2 days ago. Very heavy blood clots- will last 8 days. This is 2nd cycle within a month since April 25th that lasted 9 days. Obgyn appt June 12th to discuss bleeding ?  ?   ?Here for follow-up on anemia.  She has heavy periods, just got off a heavy cycle 2 days ago.  She had extra breakthrough bleeding which seem like to menstrual periods this past month.  She is seeing gynecology soon to discuss the bleeding.  She denies any blood in the stool.  No other bleeding or bruising.  She does have some Ice cream craving.  She does not feel particularly fatigued compared to usual.  No palpitations no shortness of breath.  She had her first iron infusion on March 10.  She has been taking iron daily.  No other aggravating or relieving factors.   ? ?No other c/o. ? ?Past Medical History:  ?Diagnosis Date  ? Headache   ? no aura  ? Iron deficiency anemia   ? Kidney stone   ? 2009  ? ?Current Outpatient Medications on File Prior to Visit  ?Medication Sig Dispense Refill  ? ferrous fumarate-iron polysaccharide complex (TANDEM) 162-115.2 MG CAPS capsule Take 1 capsule by mouth daily with breakfast. 90 capsule 1  ? ibuprofen (ADVIL) 200 MG tablet Take 400 mg by mouth every 6 (six) hours as needed.    ? norelgestromin-ethinyl estradiol (ORTHO EVRA) 150-35 MCG/24HR transdermal patch Place 1 patch onto the skin once a week. Start the first Sunday after your period begins 12 patch 2  ? ?No current facility-administered medications on file prior to visit.  ? ? ? ?The following portions of the patient's history were reviewed and updated as appropriate: allergies, current medications, past family history, past medical history, past social history, past surgical history  and problem list. ? ?ROS ?Otherwise as in subjective above ? ?Objective: ?BP 104/66   Pulse (!) 104   Temp 98.6 ?F (37 ?C)  ? ?General appearance: alert, no distress, well developed, well nourished ?Oral cavity: MMM, no lesions ?Neck: supple, no lymphadenopathy, no thyromegaly, no masses ?Heart: RRR, normal S1, S2, no murmurs ?Lungs: CTA bilaterally, no wheezes, rhonchi, or rales ?Pulses: 2+ radial pulses, 2+ pedal pulses, normal cap refill ?Ext: no edema ? ? ? ?Assessment: ?Encounter Diagnoses  ?Name Primary?  ? Iron deficiency anemia due to chronic blood loss Yes  ? Menorrhagia with regular cycle   ? ? ? ?Plan: ?We discussed her concerns, bleeding, labs.  We will go ahead and set up for iron infusion stat.  Hemoglobin had dropped down to 7.6 on labs yesterday despite the recent infusion and despite oral iron.  I asked her to go to twice a day on her iron by mouth.  I will also reach out to her gynecologist to get back in soon to discuss the heavy bleeding which is the most likely source of the anemia. ? ?Of note negative fecal occult blood test November 12, 2021. ? ? ?Kristina Leon was seen today for discuss low iron. ? ?Diagnoses and all orders for this visit: ? ?Iron deficiency anemia due to chronic blood loss ? ?Menorrhagia with regular cycle ? ?Other orders ?-  FeFum-FePo-FA-B Cmp-C-Zn-Mn-Cu (TANDEM PLUS) 162-115.2-1 MG CAPS; Take 1 tablet by mouth in the morning and at bedtime. ? ? ?Follow up: with gynecology, infusion  ? ?

## 2021-12-15 ENCOUNTER — Ambulatory Visit: Payer: 59 | Admitting: Family Medicine

## 2021-12-15 ENCOUNTER — Encounter: Payer: Self-pay | Admitting: Family Medicine

## 2021-12-15 VITALS — BP 120/80 | HR 91 | Temp 99.3°F | Wt 222.8 lb

## 2021-12-15 DIAGNOSIS — M722 Plantar fascial fibromatosis: Secondary | ICD-10-CM | POA: Diagnosis not present

## 2021-12-15 NOTE — Progress Notes (Signed)
? ?  Subjective:  ? ? Patient ID: Kristina Leon, female    DOB: [birth year], 36 y.o.   MRN: [id number] ? ?HPI ?She noted the onset of left heel pain last Saturday.  She was wearing heels prior to that but does not remember any injury.  The pain is made worse with her first initial steps in the morning or if she sits for a longer period of time and then gets up, the pain will recur.  No previous history of this problem.  ? ? ?Review of Systems ? ?   ?Objective:  ? Physical Exam ?Exam of the left foot shows normal motion of the foot and ankle.  Tender to palpation over the calcaneal spur.  No joint laxity noted. ? ? ? ?   ?Assessment & Plan:  ?Plantar fasciitis of left foot ?Information given concerning plantar fasciitis and therapy on how to take care of it especially with good stretching exercises as well as using arch supports and 2 Aleve twice per day.  If she continues have difficulty she is to return here for Ortho evaluation. ? ?

## 2021-12-15 NOTE — Patient Instructions (Signed)
Plantar Fasciitis  Plantar fasciitis is a painful foot condition that affects the heel. It occurs when the band of tissue that connects the toes to the heel bone (plantar fascia) becomes irritated. This can happen as the result of exercising too much or doing other repetitive activities (overuse injury). Plantar fasciitis can cause mild irritation to severe pain that makes it difficult to walk or move. The pain is usually worse in the morning after sleeping, or after sitting or lying down for a period of time. Pain may also be worse after long periods of walking or standing. What are the causes? This condition may be caused by: Standing for long periods of time. Wearing shoes that do not have good arch support. Doing activities that put stress on joints (high-impact activities). This includes ballet and exercise that makes your heart beat faster (aerobic exercise), such as running. Being overweight. An abnormal way of walking (gait). Tight muscles in the back of your lower leg (calf). High arches in your feet or flat feet. Starting a new athletic activity. What are the signs or symptoms? The main symptom of this condition is heel pain. Pain may get worse after the following: Taking the first steps after a time of rest, especially in the morning after awakening, or after you have been sitting or lying down for a while. Long periods of standing still. Pain may decrease after 30-45 minutes of activity, such as gentle walking. How is this diagnosed? This condition may be diagnosed based on your medical history, a physical exam, and your symptoms. Your health care provider will check for: A tender area on the bottom of your foot. A high arch in your foot or flat feet. Pain when you move your foot. Difficulty moving your foot. You may have imaging tests to confirm the diagnosis, such as: X-rays. Ultrasound. MRI. How is this treated? Treatment for plantar fasciitis depends on how severe your  condition is. Treatment may include: Rest, ice, pressure (compression), and raising (elevating) the affected foot. This is called RICE therapy. Your health care provider may recommend RICE therapy along with over-the-counter pain medicines to manage your pain. Exercises to stretch your calves and your plantar fascia. A splint that holds your foot in a stretched, upward position while you sleep (night splint). Physical therapy to relieve symptoms and prevent problems in the future. Injections of steroid medicine (cortisone) to relieve pain and inflammation. Stimulating your plantar fascia with electrical impulses (extracorporeal shock wave therapy). This is usually the last treatment option before surgery. Surgery, if other treatments have not worked after 12 months. Follow these instructions at home: Managing pain, stiffness, and swelling  If directed, put ice on the painful area. To do this: Put ice in a plastic bag, or use a frozen bottle of water. Place a towel between your skin and the bag or bottle. Roll the bottom of your foot over the bag or bottle. Do this for 20 minutes, 2-3 times a day. Wear athletic shoes that have air-sole or gel-sole cushions, or try soft shoe inserts that are designed for plantar fasciitis. Elevate your foot above the level of your heart while you are sitting or lying down. Activity Avoid activities that cause pain. Ask your health care provider what activities are safe for you. Do physical therapy exercises and stretches as told by your health care provider. Try activities and forms of exercise that are easier on your joints (low impact). Examples include swimming, water aerobics, and biking. General instructions Take over-the-counter   and prescription medicines only as told by your health care provider. ?Wear a night splint while sleeping, if told by your health care provider. Loosen the splint if your toes tingle, become numb, or turn cold and blue. ?Maintain a  healthy weight, or work with your health care provider to lose weight as needed. ?Keep all follow-up visits. This is important. ?Contact a health care provider if you have: ?Symptoms that do not go away with home treatment. ?Pain that gets worse. ?Pain that affects your ability to move or do daily activities. ?Summary ?Plantar fasciitis is a painful foot condition that affects the heel. It occurs when the band of tissue that connects the toes to the heel bone (plantar fascia) becomes irritated. ?Heel pain is the main symptom of this condition. It may get worse after exercising too much or standing still for a long time. ?Treatment varies, but it usually starts with rest, ice, pressure (compression), and raising (elevating) the affected foot. This is called RICE therapy. Over-the-counter medicines can also be used to manage pain. ?This information is not intended to replace advice given to you by your health care provider. Make sure you discuss any questions you have with your health care provider. ?Make sure you stretch ?you can take 2 Aleve twice per day to help with the pain can use the arch supports do that for 2 to 4 weeks.  Stretching like I showed ?Document Revised: 11/05/2019 Document Reviewed: 11/05/2019 ?Elsevier Patient Education ? [employer]. ? ?

## 2021-12-22 ENCOUNTER — Telehealth: Payer: Self-pay | Admitting: Physician Assistant

## 2021-12-22 NOTE — Telephone Encounter (Signed)
Please call she has not heard from infusion center

## 2021-12-23 NOTE — Telephone Encounter (Signed)
Called and spoke to Patient Kristina Leon and they have reached out to patient and couldn't get her. I have called pt and will send a mychart message as well

## 2021-12-24 ENCOUNTER — Telehealth: Payer: Self-pay | Admitting: Internal Medicine

## 2021-12-24 ENCOUNTER — Non-Acute Institutional Stay (HOSPITAL_COMMUNITY)
Admission: RE | Admit: 2021-12-24 | Discharge: 2021-12-24 | Disposition: A | Discharge status: Home / Self Care | Payer: 59 | Source: Ambulatory Visit | Attending: Internal Medicine | Admitting: Internal Medicine

## 2021-12-24 DIAGNOSIS — D500 Iron deficiency anemia secondary to blood loss (chronic): Secondary | ICD-10-CM

## 2021-12-24 DIAGNOSIS — E611 Iron deficiency: Secondary | ICD-10-CM | POA: Insufficient documentation

## 2021-12-24 MED ORDER — METHYLPREDNISOLONE SODIUM SUCC 125 MG IJ SOLR
125.0000 mg | Freq: Once | INTRAMUSCULAR | Status: AC
  Administered 2021-12-24: 125 mg via INTRAVENOUS
  Filled 2021-12-24: qty 2

## 2021-12-24 MED ORDER — SODIUM CHLORIDE 0.9 % IV SOLN
510.0000 mg | Freq: Once | INTRAVENOUS | Status: AC
  Administered 2021-12-24: 510 mg via INTRAVENOUS
  Filled 2021-12-24: qty 17

## 2021-12-24 MED ORDER — SODIUM CHLORIDE 0.9 % IV SOLN
25.0000 mg | Freq: Once | INTRAVENOUS | Status: DC
  Filled 2021-12-24: qty 1

## 2021-12-24 MED ORDER — SODIUM CHLORIDE 0.9 % IV SOLN
12.5000 mg | Freq: Once | INTRAVENOUS | Status: AC
  Administered 2021-12-24: 12.5 mg via INTRAVENOUS
  Filled 2021-12-24: qty 12.5

## 2021-12-24 MED ORDER — SODIUM CHLORIDE 0.9 % IV BOLUS
500.0000 mL | Freq: Once | INTRAVENOUS | Status: AC
  Administered 2021-12-24: 500 mL via INTRAVENOUS

## 2021-12-24 MED ORDER — SODIUM CHLORIDE 0.9 % IV SOLN
INTRAVENOUS | Status: DC | PRN
Start: 2021-12-24 — End: 2021-12-25

## 2021-12-24 NOTE — Telephone Encounter (Signed)
Kristina Leon from [facility] called and said that 5 mins in to the iron infusion she started getting flushed and hot and nauseated and started vomiting and heart racing. They stopped the Iron infusion and a Provider came and examined her and give her IV bolus and med for nausea. She is doing much better but they can' not continue the iron infusion

## 2021-12-24 NOTE — Evaluation (Signed)
Oleva A. 23, 36 y/o female with history of has a past medical history of Headache, Iron deficiency anemia, and Kidney stone[facility]   Patient scheduled to receive iron infusion for anemia today. During infusion patient began having diffuse abdominal pain, facial flushing and nausea with vomiting. Infusion was discontinued by primary nurse and NP notified. Patient endorses having iron infusion in the past without SE.  On PE patient appears to be in mild distress actively vomiting clear bolus. Breathing even and non labored. Cardiac exam wnl. Facial flushing noted, without swelling. Airway patent and patient managing secretions without difficulty. No rash or other eruptions on the skin . Abdominal exam wnl. AO x 4.   9:30 AM Symptoms began to improve after discontinuation of infusion. Promethazine and iv steroid ordered   9:49 AM Patient states that abdominal pain has returned and now located in the mid epigastric area. Awaiting medications. Appears to be in no acute distress since infusion d/c'd. Nurse to contact ordering physician for additional treatment plan. Will continue to monitor  10:36 AM Patient has received medications, symptoms have mostly improved. Denies any nausea/ vomiting since receiving medications. Currently resting and in no acute distress.   11:30 AM Nurse contacted and discussed case with PCP nurse. Patient remains stable. VSS. Nurse notified to discharge patient after completion of medications. Iron infusion remains discontinued at this time

## 2021-12-24 NOTE — Telephone Encounter (Signed)
Pt was notified and I put in urgent referral for this

## 2021-12-24 NOTE — Progress Notes (Signed)
Patient receiving Feraheme 510 mg infusion via PIV ordered by Chana Bode, MD. Around 5 minutes into the infusion patient began to cough and reported that her "face felt hot" and she had abdominal pain with nausea. Patient's face appeared red and flushed. Iron infusion stopped. Patient vomited. Vital signs taken (BP 142/91) and [place], FNP called to come and assess patient.  Ina Kick, FNP assessed patient and orders placed for 500 cc bolus NS, 125 mg Solu-medrol IV and 12.5 mg Phenergan IVPB.  After medications given and Bolus started, patient reported that her nausea and abdominal pain was starting to resolve. Patient's vital signs taken and stable (BP 129/80). At completion of fluid bolus, patient reported that all symptoms had completely resolved. Patient's face was no longer flushed. Called and spoke with Dr. Madelyn Brunner RN Gabriel Cirri. Notified Gabriel Cirri of patient's reaction and the medications given to treat reaction. Gabriel Cirri will notify Dr. Glade Lloyd.  Patient will not receive remainder of iron infusion today. Observed patient for 1 hour after completion of fluid bolus. Vital signs stable. (BP 130/73). Advised patient to go the Urgent Care or ED if symptoms reappear. Patient expressed an understanding. Patient alert, oriented and ambulatory at discharge.

## 2021-12-25 ENCOUNTER — Telehealth: Payer: Self-pay | Admitting: Hematology

## 2021-12-25 NOTE — Telephone Encounter (Signed)
Scheduled appt per 5/25 referral. Pt is aware of appt date and time. Pt is aware to arrive 15 mins prior to appt time and to bring and updated insurance card. Pt is aware of appt location.

## 2022-01-04 ENCOUNTER — Inpatient Hospital Stay: Payer: 59

## 2022-01-04 ENCOUNTER — Inpatient Hospital Stay: Payer: 59 | Attending: Hematology | Admitting: Hematology

## 2022-01-04 VITALS — BP 121/75 | HR 101 | Temp 98.3°F | Resp 18 | Ht 65.0 in | Wt 222.5 lb

## 2022-01-04 DIAGNOSIS — N920 Excessive and frequent menstruation with regular cycle: Secondary | ICD-10-CM | POA: Insufficient documentation

## 2022-01-04 DIAGNOSIS — D509 Iron deficiency anemia, unspecified: Secondary | ICD-10-CM

## 2022-01-04 DIAGNOSIS — D500 Iron deficiency anemia secondary to blood loss (chronic): Secondary | ICD-10-CM | POA: Diagnosis present

## 2022-01-04 LAB — CMP (CANCER CENTER ONLY)
ALT: 8 U/L (ref 0–44)
AST: 11 U/L — ABNORMAL LOW (ref 15–41)
Albumin: 3.6 g/dL (ref 3.5–5.0)
Alkaline Phosphatase: 56 U/L (ref 38–126)
Anion gap: 7 (ref 5–15)
BUN: 10 mg/dL (ref 6–20)
CO2: 29 mmol/L (ref 22–32)
Calcium: 9.1 mg/dL (ref 8.9–10.3)
Chloride: 105 mmol/L (ref 98–111)
Creatinine: 0.63 mg/dL (ref 0.44–1.00)
GFR, Estimated: >60 mL/min (ref >60)
Glucose, Bld: 105 mg/dL — ABNORMAL HIGH (ref 70–99)
Potassium: 4 mmol/L (ref 3.5–5.1)
Sodium: 141 mmol/L (ref 135–145)
Total Bilirubin: 0.2 mg/dL — ABNORMAL LOW (ref 0.3–1.2)
Total Protein: 6.9 g/dL (ref 6.5–8.1)

## 2022-01-04 LAB — CBC WITH DIFFERENTIAL (CANCER CENTER ONLY)
Abs Immature Granulocytes: 0.01 10*3/uL (ref 0.00–0.07)
Basophils Absolute: 0.1 10*3/uL (ref 0.0–0.1)
Basophils Relative: 1 %
Eosinophils Absolute: 0.3 10*3/uL (ref 0.0–0.5)
Eosinophils Relative: 4 %
HCT: 33.9 % — ABNORMAL LOW (ref 36.0–46.0)
Hemoglobin: 10.1 g/dL — ABNORMAL LOW (ref 12.0–15.0)
Immature Granulocytes: 0 %
Lymphocytes Relative: 27 %
Lymphs Abs: 1.7 10*3/uL (ref 0.7–4.0)
MCH: 24.8 pg — ABNORMAL LOW (ref 26.0–34.0)
MCHC: 29.8 g/dL — ABNORMAL LOW (ref 30.0–36.0)
MCV: 83.1 fL (ref 80.0–100.0)
Monocytes Absolute: 0.6 10*3/uL (ref 0.1–1.0)
Monocytes Relative: 9 %
Neutro Abs: 3.8 10*3/uL (ref 1.7–7.7)
Neutrophils Relative %: 59 %
Platelet Count: 383 10*3/uL (ref 150–400)
RBC: 4.08 MIL/uL (ref 3.87–5.11)
RDW: 23.6 % — ABNORMAL HIGH (ref 11.5–15.5)
WBC Count: 6.3 10*3/uL (ref 4.0–10.5)
nRBC: 0 % (ref 0.0–0.2)

## 2022-01-04 LAB — IRON AND IRON BINDING CAPACITY (CC-WL,HP ONLY)
Iron: 17 ug/dL — ABNORMAL LOW (ref 28–170)
Saturation Ratios: 5 % — ABNORMAL LOW (ref 10.4–31.8)
TIBC: 349 ug/dL (ref 250–450)
UIBC: 332 ug/dL (ref 148–442)

## 2022-01-04 LAB — VITAMIN B12: Vitamin B-12: 293 pg/mL (ref 180–914)

## 2022-01-04 LAB — FERRITIN: Ferritin: 23 ng/mL (ref 11–307)

## 2022-01-04 NOTE — Progress Notes (Signed)
HEMATOLOGY/ONCOLOGY CONSULTATION NOTE  Date of Service: 01/04/2022  Patient Care Team: Marcellina Millin as PCP - General (Physician Assistant)  CHIEF COMPLAINTS/PURPOSE OF CONSULTATION:  Evaluation and consultation of iron deficiency anemia secondary to chronic blood loss from menorrhagia.  HISTORY OF PRESENTING ILLNESS:   Kristina Leon is a wonderful 36 y.o. female who has been referred to [place] by Irene Pap, PA-C for evaluation and consultation of iron deficiency anemia secondary to chronic blood loss from menorrhagia.   She reports that soon after recent Feraheme IV iron she experienced vomiting. We discussed she may need IV iron and we can consider other preparations such as IV Injectafer.   We discussed starting a vitamin B complex which she was agreeable to.  She notes that her significant blood loss is from menorrhagia.  She notes iron deficiency anemia is common in her family.  She notes mild symptoms of PICA. She notes consuming more ice cream and smoothies which she notes is not as normal for her.  She reports some pedal edema when standing for long periods of time. She has not noticed any of this since 12/11/2021.  No history of blood transfusions.  No other abnormal bleeding. No other new or acute focal symptoms.  Labs done 12/09/2021 were reviewed in detail. CBC stable with Hgb at 7.6 and RBC count of 3.41. Iron reduced at 13. Labs done 09/30/2021 were also reviewed. Iron low at 16. Iron sat ratio at 4% Ferritin at 21. 11/12/2021 Fecal occult blood negative  MEDICAL HISTORY:  Past Medical History:  Diagnosis Date   Headache    no aura   Iron deficiency anemia    Kidney stone    2009    SURGICAL HISTORY: Past Surgical History:  Procedure Laterality Date   BREAST SURGERY Right    cyst removed   CYSTECTOMY     BREAST   MYOMECTOMY N/A 01/06/2016   Procedure: MYOMECTOMY-abdominal;  Surgeon: Anastasio Auerbach, MD;  Location: [facility];   Service: Gynecology;  Laterality: N/A;    SOCIAL HISTORY: Social History   Socioeconomic History   Marital status: Single    Spouse name: Not on file   Number of children: Not on file   Years of education: Not on file   Highest education level: Not on file  Occupational History   Not on file  Tobacco Use   Smoking status: Never   Smokeless tobacco: Never  Vaping Use   Vaping Use: Never used  Substance and Sexual Activity   Alcohol use: No    Alcohol/week: 0.0 standard drinks   Drug use: No   Sexual activity: Never    Comment: Virgin  Other Topics Concern   Not on file  Social History Narrative   Lives in [place], lives with parents and several sisters and brothers. From [place] originally, then lived in [place]. Speaks English well.    Social Determinants of Health   Financial Resource Strain: Not on file  Food Insecurity: Not on file  Transportation Needs: Not on file  Physical Activity: Not on file  Stress: Not on file  Social Connections: Not on file  Intimate Partner Violence: Not on file    FAMILY HISTORY: Family History  Problem Relation Age of Onset   Anemia Mother    Anemia Sister    Breast cancer Maternal Aunt 28    ALLERGIES:  is allergic to feraheme [ferumoxytol] and pork-derived products.  MEDICATIONS:  Current Outpatient Medications  Medication Sig  Dispense Refill   FeFum-FePo-FA-B Cmp-C-Zn-Mn-Cu (TANDEM PLUS) 162-115.2-1 MG CAPS Take 1 tablet by mouth in the morning and at bedtime. 180 capsule 1   ferrous fumarate-iron polysaccharide complex (TANDEM) 162-115.2 MG CAPS capsule Take 1 capsule by mouth daily with breakfast. (Patient not taking: Reported on 12/15/2021) 90 capsule 1   ibuprofen (ADVIL) 200 MG tablet Take 400 mg by mouth every 6 (six) hours as needed.     iron polysaccharides (NIFEREX) 150 MG capsule Take 150 mg by mouth every morning.     norelgestromin-ethinyl estradiol (ORTHO EVRA) 150-35 MCG/24HR transdermal patch Place 1 patch onto  the skin once a week. Start the first Sunday after your period begins 12 patch 2   No current facility-administered medications for this visit.    REVIEW OF SYSTEMS:    10 Point review of Systems was done is negative except as noted above.  PHYSICAL EXAMINATION: ECOG PERFORMANCE STATUS: 1 - Symptomatic but completely ambulatory  . Vitals:   01/04/22 1123  BP: 121/75  Pulse: (!) 101  Resp: 18  Temp: 98.3 F (36.8 C)  SpO2: 100%   Filed Weights   01/04/22 1123  Weight: 222 lb 8 oz (100.9 kg)   .Body mass index is 37.03 kg/m. NAD GENERAL:alert, in no acute distress and comfortable SKIN: no acute rashes, no significant lesions EYES: conjunctiva are pink and non-injected, sclera anicteric NECK: supple, no JVD LYMPH:  no palpable lymphadenopathy in the cervical, axillary or inguinal regions LUNGS: clear to auscultation b/l with normal respiratory effort HEART: regular rate & rhythm ABDOMEN:  normoactive bowel sounds , non tender, not distended. Extremity: no pedal edema PSYCH: alert & oriented x 3 with fluent speech NEURO: no focal motor/sensory deficits  LABORATORY DATA:  I have reviewed the data as listed .    Latest Ref Rng & Units 01/04/2022   12:05 PM 12/09/2021   11:28 AM 10/29/2021    2:06 PM  CBC  WBC 4.0 - 10.5 K/uL 6.3  6.7  6.7   Hemoglobin 12.0 - 15.0 g/dL 10.1  7.6  11.0   Hematocrit 36.0 - 46.0 % 33.9  25.1  35.8   Platelets 150 - 400 K/uL 383  408  463    .    Latest Ref Rng & Units 01/04/2022   12:05 PM 04/22/2021    4:01 PM 05/02/2020    3:12 PM  CMP  Glucose 70 - 99 mg/dL 105  107  96   BUN 6 - 20 mg/dL '10  9  13   '$ Creatinine 0.44 - 1.00 mg/dL 0.63  0.68  0.64   Sodium 135 - 145 mmol/L 141  141  142   Potassium 3.5 - 5.1 mmol/L 4.0  3.9  4.1   Chloride 98 - 111 mmol/L 105  100  106   CO2 22 - 32 mmol/L '29  23  24   '$ Calcium 8.9 - 10.3 mg/dL 9.1  9.2  9.1   Total Protein 6.5 - 8.1 g/dL 6.9  6.9  7.3   Total Bilirubin 0.3 - 1.2 mg/dL 0.2  0.2   0.2   Alkaline Phos 38 - 126 U/L 56  64  71   AST 15 - 41 U/L '11  13  12   '$ ALT 0 - 44 U/L '8  5  7    '$ . Lab Results  Component Value Date   IRON 17 (L) 01/04/2022   TIBC 349 01/04/2022   IRONPCTSAT 5 (L) 01/04/2022   (Iron  and TIBC)  Lab Results  Component Value Date   FERRITIN 23 01/04/2022   B12 -- 293    RADIOGRAPHIC STUDIES: I have personally reviewed the radiological images as listed and agreed with the findings in the report. No results found.  ASSESSMENT & PLAN:   36 y.o. very pleasant female with  1. 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 CBC  Hgb at 7.6 and RBC count of 3.41. Iron reduced at 13. Labs done 09/30/2021 were also reviewed. Iron low at 16. Iron sat ratio at 4% Ferritin at 21. 11/12/2021 Fecal occult blood negative  Plan -Labs done 12/09/2021 were reviewed in detail. CBC stable with Hgb at 7.6 and RBC count of 3.41. Iron reduced at 13. Labs done 09/30/2021 were also reviewed. Iron low at 16. Iron sat ratio at 4% Ferritin at 21. 11/12/2021 Fecal occult blood negative -Patient's chart reviewed in detail -We will get labs today to evaluate her current status further and for treatment planning. -start a vitamin B complex. -Continue oral iron supplementation. -Schedule Injectafer IV iron weekly 2x doses -RTC in 2 months with labs  Follow up: IV Injectafer weekly x 2 doses ASAP RTC with Dr Irene Limbo with labs in 2 months  All of the patients questions were answered with apparent satisfaction. The patient knows to call the clinic with any problems, questions or concerns. .The total time spent in the appointment was 45 minutes* .  All of the patient's questions were answered with apparent satisfaction. The patient knows to call the clinic with any problems, questions or concerns.   Sullivan Lone MD MS AAHIVMS [facility] Hematology/Oncology Physician [facility]  .*Total Encounter Time as defined by the Centers for Medicare and Medicaid Services includes, in addition to the face-to-face time of a patient visit (documented in the note above) non-face-to-face time: obtaining and reviewing outside history, ordering and reviewing medications, tests or procedures, care coordination (communications with other health care professionals or caregivers) and documentation in the medical record.   I, Melene Muller, am acting as scribe for Fabienne Bruns, MD. .I have reviewed the above documentation for accuracy and completeness, and I agree with the above. Brunetta Genera MD

## 2022-01-05 ENCOUNTER — Telehealth: Payer: Self-pay | Admitting: Hematology

## 2022-01-05 ENCOUNTER — Encounter: Payer: Self-pay | Admitting: Hematology

## 2022-01-05 NOTE — Telephone Encounter (Signed)
Left message with follow-up appointments per 6/5 los.

## 2022-01-06 LAB — HGB FRACTIONATION CASCADE
Hgb A2: 2.4 % (ref 1.8–3.2)
Hgb A: 97.6 % (ref 96.4–98.8)
Hgb F: 0 % (ref 0.0–2.0)
Hgb S: 0 %

## 2022-01-10 ENCOUNTER — Encounter: Payer: Self-pay | Admitting: Hematology

## 2022-01-11 ENCOUNTER — Ambulatory Visit (INDEPENDENT_AMBULATORY_CARE_PROVIDER_SITE_OTHER): Payer: 59 | Admitting: Nurse Practitioner

## 2022-01-11 ENCOUNTER — Inpatient Hospital Stay: Payer: 59

## 2022-01-11 ENCOUNTER — Other Ambulatory Visit: Payer: Self-pay

## 2022-01-11 ENCOUNTER — Encounter: Payer: Self-pay | Admitting: Nurse Practitioner

## 2022-01-11 VITALS — BP 112/84 | HR 76 | Temp 98.3°F | Resp 18 | Wt 227.0 lb

## 2022-01-11 VITALS — BP 112/60 | Ht 65.0 in | Wt 227.0 lb

## 2022-01-11 DIAGNOSIS — N920 Excessive and frequent menstruation with regular cycle: Secondary | ICD-10-CM

## 2022-01-11 DIAGNOSIS — D259 Leiomyoma of uterus, unspecified: Secondary | ICD-10-CM | POA: Diagnosis not present

## 2022-01-11 DIAGNOSIS — N921 Excessive and frequent menstruation with irregular cycle: Secondary | ICD-10-CM

## 2022-01-11 DIAGNOSIS — D500 Iron deficiency anemia secondary to blood loss (chronic): Secondary | ICD-10-CM

## 2022-01-11 DIAGNOSIS — Z01419 Encounter for gynecological examination (general) (routine) without abnormal findings: Secondary | ICD-10-CM | POA: Diagnosis not present

## 2022-01-11 MED ORDER — LORATADINE 10 MG PO TABS
10.0000 mg | ORAL_TABLET | Freq: Once | ORAL | Status: AC
  Administered 2022-01-11: 10 mg via ORAL
  Filled 2022-01-11: qty 1

## 2022-01-11 MED ORDER — ETONOGESTREL-ETHINYL ESTRADIOL 0.12-0.015 MG/24HR VA RING: 1.0000 | VAGINAL_RING | VAGINAL | 3 refills | Status: DC

## 2022-01-11 MED ORDER — SODIUM CHLORIDE 0.9 % IV SOLN
300.0000 mg | Freq: Once | INTRAVENOUS | Status: AC
  Administered 2022-01-11: 300 mg via INTRAVENOUS
  Filled 2022-01-11: qty 300

## 2022-01-11 MED ORDER — MYFEMBREE 40-1-0.5 MG PO TABS: 1.0000 | ORAL_TABLET | Freq: Every day | ORAL | 5 refills | Status: DC

## 2022-01-11 MED ORDER — SODIUM CHLORIDE 0.9 % IV SOLN: Freq: Once | INTRAVENOUS | Status: AC

## 2022-01-11 NOTE — Progress Notes (Signed)
Patient tolerated her iron well today. Vss- BP 112/84 (BP Location: Left Arm, Patient Position: Sitting)   Pulse 76   Temp 98.3 F (36.8 C) (Oral)   Resp 18   Wt 227 lb (103 kg)   LMP 01/08/2022 (Approximate)   SpO2 100%   BMI 37.77 kg/m  Offered food and fluids. IV removed. Ambulatory on discharge with no complaints.

## 2022-01-11 NOTE — Patient Instructions (Signed)

## 2022-01-11 NOTE — Progress Notes (Signed)
Kristina Leon [birth year] [id number]   History:  36 y.o. G0 presents for annual exam. H/O heavy menses s/t uterine fibroids. Receiving iron transfusions now, had one today. On Ortho Evra patch. Cycles are occurring more frequently about every 2-3 weeks that lasts up to 10 days. Heavy bleeding with clots occurs 3-5 days each cycle. Multiple myomectomies 01/2016 . Engaged with plans to marry in a few months. She plans to try to conceive right away. Never been sexually active. Normal pap history.  Gynecologic History Patient's last menstrual period was 12/24/2021. Period Duration (Days): 10 Period Pattern: (!) Irregular Menstrual Flow: Heavy Dysmenorrhea: None Contraception/Family planning: abstinence and Ortho-Evra patches weekly Sexually active: No  Health Maintenance Last Pap: 12/30/2017. Results were: Normal, 5-year repeat Last mammogram: Not indicated Last colonoscopy: Not indicated Last Dexa: Not indicated  Past medical history, past surgical history, family history and social history were all reviewed and documented in the EPIC chart. Engaged. 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   ROS:  A ROS was performed and pertinent positives and negatives are included.  Exam:  Vitals:   01/11/22 1325  BP: 112/60  Weight: 227 lb (103 kg)  Height: '5\' 5"'$  (1.651 m)    Body mass index is 37.77 kg/m.  General appearance:  Normal Thyroid:  Symmetrical, normal in size, without palpable masses or nodularity. Respiratory  Auscultation:  Clear without wheezing or rhonchi Cardiovascular  Auscultation:  Regular rate, without rubs, murmurs or gallops  Edema/varicosities:  Not grossly evident Abdominal  Soft,nontender, without masses, guarding or rebound.  Liver/spleen:  No organomegaly noted  Hernia:  None appreciated  Skin  Inspection:  Grossly normal Breasts: Examined lying and sitting.   Right: Without masses, retractions, nipple discharge or axillary adenopathy.   Left: Without  masses, retractions, nipple discharge or axillary adenopathy. Genitourinary   Inguinal/mons:  Normal without inguinal adenopathy  External genitalia:  Normal appearing vulva with no masses, tenderness, or lesions  BUS/Urethra/Skene's glands:  Normal  Vagina:  Normal appearing with normal color and discharge, no lesions  Cervix:  Normal appearing without discharge or lesions  Uterus:  14 week fibroid uterus, nontender  Adnexa/parametria:     Rt: Normal in size, without masses or tenderness.   Lt: Normal in size, without masses or tenderness.  Anus and perineum: Normal  Patient informed chaperone available to be present for breast and pelvic exam. Patient has requested no chaperone to be present. Patient has been advised what will be completed during breast and pelvic exam.   Assessment/Plan:  36 y.o. G0 for annual exam.   Well female exam with routine gynecological exam - Education provided on SBEs, importance of preventative screenings, current guidelines, high calcium diet, regular exercise, and multivitamin daily. Labs with PCP.   Menorrhagia with regular cycle - Plan: etonogestrel-ethinyl estradiol (NUVARING) 0.12-0.015 MG/24HR vaginal ring monthly, Relugolix-Estradiol-Norethind (MYFEMBREE) 40-1-0.5 MG TABS daily. Discussed nature of fibroids and heavy menses. Recommend switching from patch due to BMI of 37 as it may not be as effective. We also discussed the option for Myfembree and she is agreeable. She is aware Myfembree is not a contraceptive but she is not sexually active and does not plan to be until marriage. She would like to try vaginal ring if unable to start Myfembree. She is a little nervous about inserting herself. We discussed proper use and she will insert day 1 of cycle, removed day 25, and reinsert day 28.   Uterine leiomyoma, unspecified location - Plan: etonogestrel-ethinyl estradiol (NUVARING)  0.12-0.015 MG/24HR vaginal ring monthly, Relugolix-Estradiol-Norethind  (MYFEMBREE) 40-1-0.5 MG TABS daily. She is aware Myfembree is limited to 24 months of use.   Iron deficiency anemia due to chronic blood loss - Plan: Relugolix-Estradiol-Norethind (MYFEMBREE) 40-1-0.5 MG TABS daily. Receiving iron transfusions, had one today.   Screening for cervical cancer - Normal pap history. Will repeat at 5-year interval per guidelines.  Return in 1 year for annual.    Tamela Gammon DNP, 4:22 PM 01/11/2022

## 2022-01-14 ENCOUNTER — Telehealth: Payer: Self-pay

## 2022-01-14 ENCOUNTER — Other Ambulatory Visit: Payer: Self-pay | Admitting: Nurse Practitioner

## 2022-01-14 DIAGNOSIS — B372 Candidiasis of skin and nail: Secondary | ICD-10-CM

## 2022-01-14 MED ORDER — NYSTATIN 100000 UNIT/GM EX POWD: 1.0000 | Freq: Three times a day (TID) | CUTANEOUS | 1 refills | Status: DC

## 2022-01-14 NOTE — Telephone Encounter (Signed)
I called [place], [place] and I was told that they need update insurance information on patient. The pharmacy sent a text to her phone to reach out to them to provide them with this information. Patient may call at P: [phone].

## 2022-01-14 NOTE — Telephone Encounter (Signed)
I will send nystatin powder now.

## 2022-01-14 NOTE — Telephone Encounter (Signed)
Pt called stating that she was told you were going to send her in a powder for discoloration in her vaginal/vulvar/pubic area at her recent visit. Please advise.

## 2022-01-14 NOTE — Telephone Encounter (Signed)
Left detailed VM per DPR re: information from [place] and TW.

## 2022-01-14 NOTE — Telephone Encounter (Signed)
Can you also follow up on the Myfembree to see if she was approved. If she was I want to make sure she knows not to use with the vaginal ring and to only do MyFembree.

## 2022-01-18 ENCOUNTER — Other Ambulatory Visit: Payer: Self-pay

## 2022-01-18 ENCOUNTER — Inpatient Hospital Stay: Payer: 59

## 2022-01-18 VITALS — BP 113/64 | HR 84 | Temp 98.2°F | Resp 18

## 2022-01-18 DIAGNOSIS — D500 Iron deficiency anemia secondary to blood loss (chronic): Secondary | ICD-10-CM

## 2022-01-18 DIAGNOSIS — N921 Excessive and frequent menstruation with irregular cycle: Secondary | ICD-10-CM

## 2022-01-18 MED ORDER — SODIUM CHLORIDE 0.9 % IV SOLN: Freq: Once | INTRAVENOUS | Status: AC

## 2022-01-18 MED ORDER — SODIUM CHLORIDE 0.9 % IV SOLN
300.0000 mg | Freq: Once | INTRAVENOUS | Status: AC
  Administered 2022-01-18: 300 mg via INTRAVENOUS
  Filled 2022-01-18: qty 300

## 2022-01-18 MED ORDER — LORATADINE 10 MG PO TABS
10.0000 mg | ORAL_TABLET | Freq: Once | ORAL | Status: AC
  Administered 2022-01-18: 10 mg via ORAL
  Filled 2022-01-18: qty 1

## 2022-01-18 NOTE — Patient Instructions (Signed)

## 2022-01-25 ENCOUNTER — Inpatient Hospital Stay: Payer: 59

## 2022-01-28 ENCOUNTER — Inpatient Hospital Stay: Payer: 59

## 2022-01-28 ENCOUNTER — Other Ambulatory Visit: Payer: Self-pay

## 2022-01-28 VITALS — BP 139/73 | HR 74 | Temp 99.1°F | Resp 18

## 2022-01-28 DIAGNOSIS — D500 Iron deficiency anemia secondary to blood loss (chronic): Secondary | ICD-10-CM | POA: Diagnosis not present

## 2022-01-28 DIAGNOSIS — N921 Excessive and frequent menstruation with irregular cycle: Secondary | ICD-10-CM

## 2022-01-28 MED ORDER — SODIUM CHLORIDE 0.9 % IV SOLN
300.0000 mg | Freq: Once | INTRAVENOUS | Status: AC
  Administered 2022-01-28: 300 mg via INTRAVENOUS
  Filled 2022-01-28: qty 300

## 2022-01-28 MED ORDER — SODIUM CHLORIDE 0.9 % IV SOLN: Freq: Once | INTRAVENOUS | Status: AC

## 2022-01-28 MED ORDER — LORATADINE 10 MG PO TABS
10.0000 mg | ORAL_TABLET | Freq: Every day | ORAL | Status: DC
  Administered 2022-01-28: 10 mg via ORAL
  Filled 2022-01-28: qty 1

## 2022-01-28 NOTE — Patient Instructions (Signed)

## 2022-01-28 NOTE — Progress Notes (Signed)
Patient has had iron previously and tolerated well. Patient chose not to stay for 30 minute observation. Feeling well- VSS. IV removed with tip intact.  BP 139/73 (BP Location: Right Arm, Patient Position: Sitting)   Pulse 74   Temp 99.1 F (37.3 C) (Oral)   Resp 18   LMP 01/08/2022 (Approximate)   SpO2 100%

## 2022-01-29 ENCOUNTER — Encounter: Payer: Self-pay | Admitting: Internal Medicine

## 2022-02-16 ENCOUNTER — Other Ambulatory Visit: Payer: Self-pay

## 2022-02-23 IMAGING — DX DG CHEST 2V
2 series · 2 of 2 positions shown · non-contrast
Comparison: None.

CLINICAL DATA: Chest pain

EXAM:
CHEST - 2 VIEW

[chest lat]
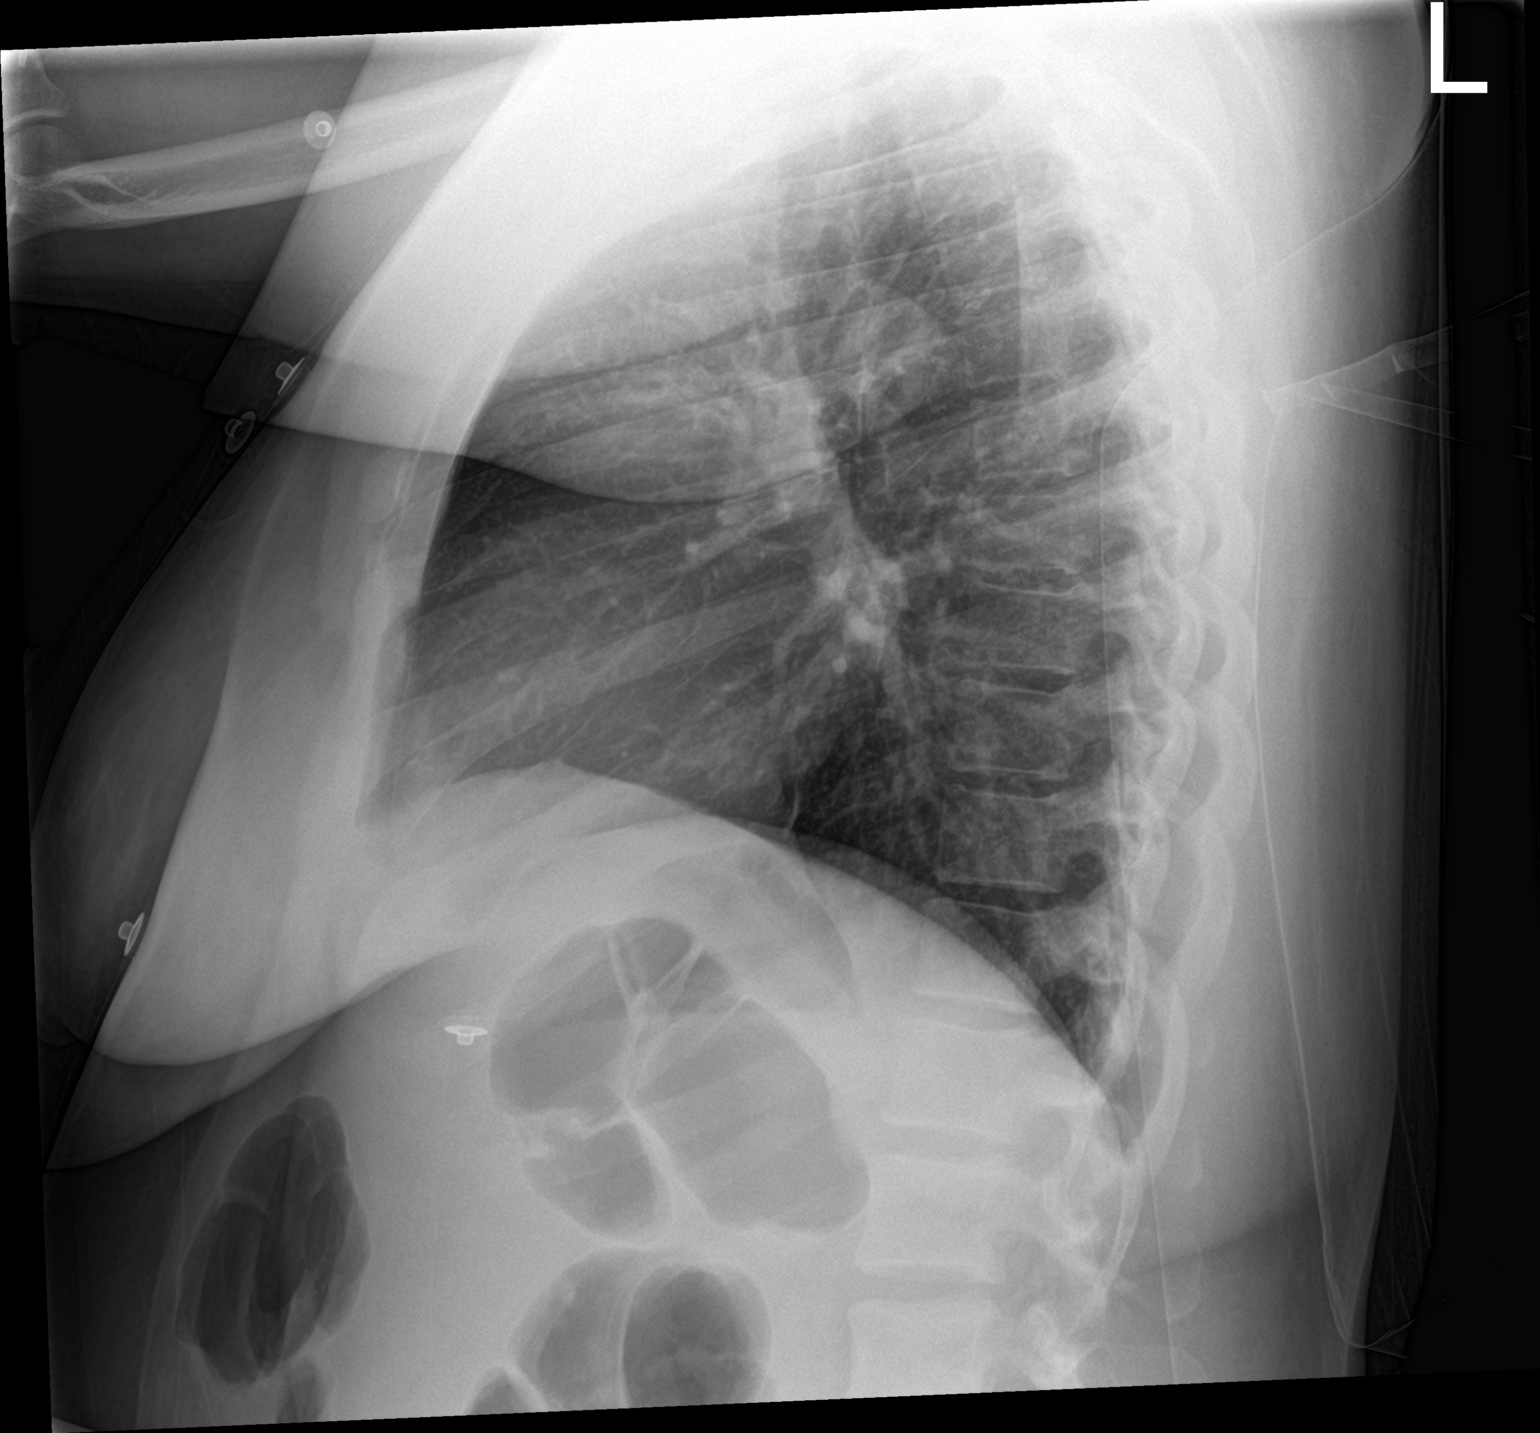

[chest ap]
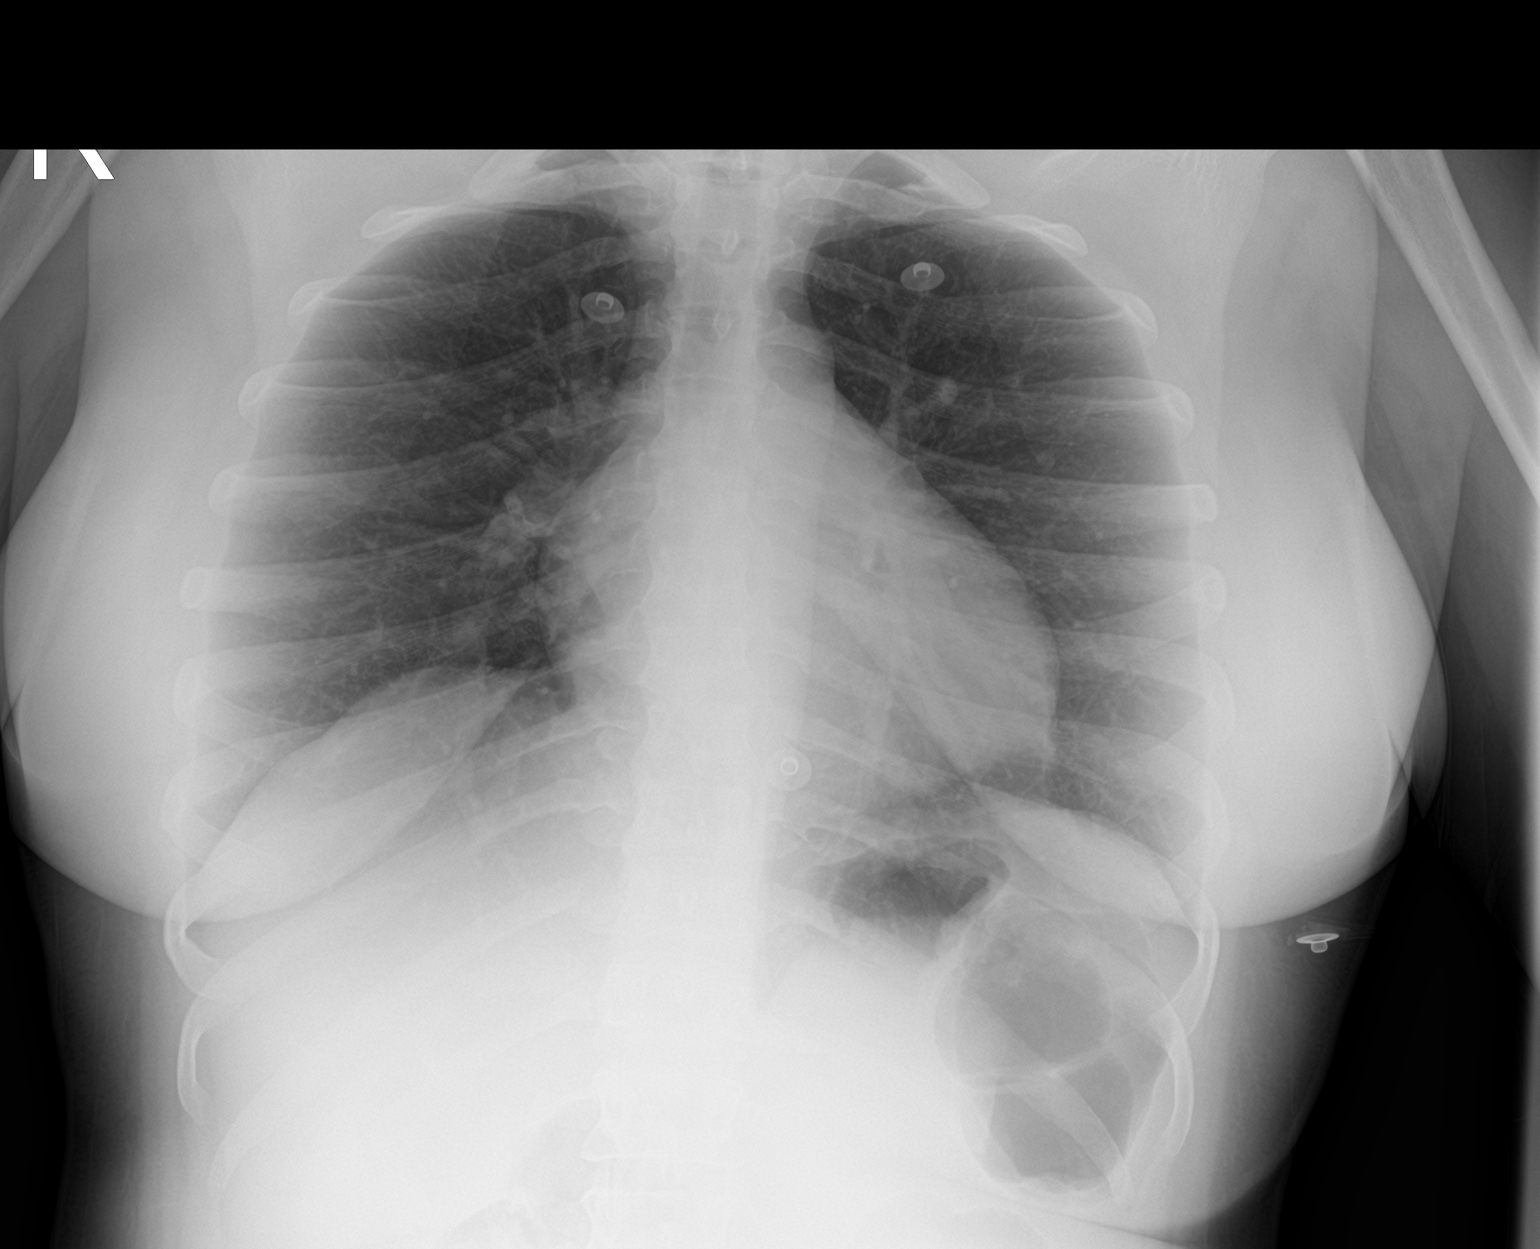

[2 of 2 positions shown; findings below may reference images not displayed]

FINDINGS: Lungs are clear. Heart size and pulmonary vascularity are normal. No
adenopathy. No pneumothorax. No bone lesions.
IMPRESSION: Lungs clear.  Cardiac silhouette normal.

## 2022-04-02 ENCOUNTER — Other Ambulatory Visit: Payer: Self-pay | Admitting: *Deleted

## 2022-04-02 DIAGNOSIS — D500 Iron deficiency anemia secondary to blood loss (chronic): Secondary | ICD-10-CM

## 2022-04-06 ENCOUNTER — Other Ambulatory Visit: Payer: Self-pay

## 2022-04-06 ENCOUNTER — Inpatient Hospital Stay: Payer: 59

## 2022-04-06 ENCOUNTER — Inpatient Hospital Stay: Payer: 59 | Attending: Hematology | Admitting: Hematology

## 2022-04-06 VITALS — BP 111/67 | HR 79 | Temp 97.9°F | Wt 221.1 lb

## 2022-04-06 DIAGNOSIS — Z793 Long term (current) use of hormonal contraceptives: Secondary | ICD-10-CM | POA: Insufficient documentation

## 2022-04-06 DIAGNOSIS — N920 Excessive and frequent menstruation with regular cycle: Secondary | ICD-10-CM | POA: Insufficient documentation

## 2022-04-06 DIAGNOSIS — D500 Iron deficiency anemia secondary to blood loss (chronic): Secondary | ICD-10-CM | POA: Insufficient documentation

## 2022-04-06 DIAGNOSIS — Z79899 Other long term (current) drug therapy: Secondary | ICD-10-CM | POA: Insufficient documentation

## 2022-04-06 LAB — CBC WITH DIFFERENTIAL (CANCER CENTER ONLY)
Abs Immature Granulocytes: 0.01 10*3/uL (ref 0.00–0.07)
Basophils Absolute: 0 10*3/uL (ref 0.0–0.1)
Basophils Relative: 1 %
Eosinophils Absolute: 0.2 10*3/uL (ref 0.0–0.5)
Eosinophils Relative: 4 %
HCT: 37.3 % (ref 36.0–46.0)
Hemoglobin: 12.5 g/dL (ref 12.0–15.0)
Immature Granulocytes: 0 %
Lymphocytes Relative: 34 %
Lymphs Abs: 1.7 10*3/uL (ref 0.7–4.0)
MCH: 28 pg (ref 26.0–34.0)
MCHC: 33.5 g/dL (ref 30.0–36.0)
MCV: 83.4 fL (ref 80.0–100.0)
Monocytes Absolute: 0.5 10*3/uL (ref 0.1–1.0)
Monocytes Relative: 10 %
Neutro Abs: 2.6 10*3/uL (ref 1.7–7.7)
Neutrophils Relative %: 51 %
Platelet Count: 357 10*3/uL (ref 150–400)
RBC: 4.47 MIL/uL (ref 3.87–5.11)
RDW: 15.2 % (ref 11.5–15.5)
WBC Count: 4.9 10*3/uL (ref 4.0–10.5)
nRBC: 0 % (ref 0.0–0.2)

## 2022-04-06 LAB — FERRITIN: Ferritin: 25 ng/mL (ref 11–307)

## 2022-04-06 LAB — IRON AND IRON BINDING CAPACITY (CC-WL,HP ONLY)
Iron: 31 ug/dL (ref 28–170)
Saturation Ratios: 10 % — ABNORMAL LOW (ref 10.4–31.8)
TIBC: 314 ug/dL (ref 250–450)
UIBC: 283 ug/dL (ref 148–442)

## 2022-04-06 LAB — CMP (CANCER CENTER ONLY)
ALT: 8 U/L (ref 0–44)
AST: 12 U/L — ABNORMAL LOW (ref 15–41)
Albumin: 3.8 g/dL (ref 3.5–5.0)
Alkaline Phosphatase: 51 U/L (ref 38–126)
Anion gap: 5 (ref 5–15)
BUN: 8 mg/dL (ref 6–20)
CO2: 27 mmol/L (ref 22–32)
Calcium: 9.1 mg/dL (ref 8.9–10.3)
Chloride: 108 mmol/L (ref 98–111)
Creatinine: 0.62 mg/dL (ref 0.44–1.00)
GFR, Estimated: >60 mL/min (ref >60)
Glucose, Bld: 95 mg/dL (ref 70–99)
Potassium: 3.6 mmol/L (ref 3.5–5.1)
Sodium: 140 mmol/L (ref 135–145)
Total Bilirubin: 0.3 mg/dL (ref 0.3–1.2)
Total Protein: 7.2 g/dL (ref 6.5–8.1)

## 2022-04-06 NOTE — Progress Notes (Signed)
HEMATOLOGY/ONCOLOGY CLINIC NOTE  Date of Service: 04/06/2022  Patient Care Team: Marcellina Millin as PCP - General (Physician Assistant)  CHIEF COMPLAINTS/PURPOSE OF CONSULTATION:  Evaluation and consultation of iron deficiency anemia secondary to chronic blood loss from menorrhagia.  HISTORY OF PRESENTING ILLNESS:  Kristina Leon is a wonderful 36 y.o. female who has been referred to [place] by Irene Pap, PA-C for evaluation and consultation of iron deficiency anemia secondary to chronic blood loss from menorrhagia.   She reports that soon after recent Feraheme IV iron she experienced vomiting. We discussed she may need IV iron and we can consider other preparations such as IV Injectafer.   We discussed starting a vitamin B complex which she was agreeable to.  She notes that her significant blood loss is from menorrhagia.  She notes iron deficiency anemia is common in her family.  She notes mild symptoms of PICA. She notes consuming more ice cream and smoothies which she notes is not as normal for her.  She reports some pedal edema when standing for long periods of time. She has not noticed any of this since 12/11/2021.  No history of blood transfusions.  No other abnormal bleeding. No other new or acute focal symptoms.  Labs done 12/09/2021 were reviewed in detail. CBC stable with Hgb at 7.6 and RBC count of 3.41. Iron reduced at 13. Labs done 09/30/2021 were also reviewed. Iron low at 16. Iron sat ratio at 4% Ferritin at 21. 11/12/2021 Fecal occult blood negative  INTERVAL HISTORY:  Kristina Leon is a 36 y.o. female is here for continued evaluation and management of iron deficiency anemia secondary to chronic blood loss from menorrhagia. She reports She is doing well with no new symptoms or concerns.  We discussed that if ferritin less than 20 she will need IV iron. She is currently taking iron polysaccharide and she notes she is tolerating it well with no  prohibitive toxicities.  We discussed that she will get infusion once more and then afterward she can take the iron polysaccharide Monday, Wednesday, and Friday. And she has been advised to follow up with PCP.   No other new or acute focal symptoms.  Labs done today were reviewed in detail.  MEDICAL HISTORY:  Past Medical History:  Diagnosis Date   Headache    no aura   Iron deficiency anemia    Kidney stone    2009    SURGICAL HISTORY: Past Surgical History:  Procedure Laterality Date   BREAST SURGERY Right    cyst removed   CYSTECTOMY     BREAST   MYOMECTOMY N/A 01/06/2016   Procedure: MYOMECTOMY-abdominal;  Surgeon: Anastasio Auerbach, MD;  Location: [facility];  Service: Gynecology;  Laterality: N/A;    SOCIAL HISTORY: Social History   Socioeconomic History   Marital status: Single    Spouse name: Not on file   Number of children: Not on file   Years of education: Not on file   Highest education level: Not on file  Occupational History   Not on file  Tobacco Use   Smoking status: Never   Smokeless tobacco: Never  Vaping Use   Vaping Use: Never used  Substance and Sexual Activity   Alcohol use: No    Alcohol/week: 0.0 standard drinks of alcohol   Drug use: No   Sexual activity: Never    Birth control/protection: Patch    Comment: Virgin  Other Topics Concern   Not on  file  Social History Narrative   Lives in [place], lives with parents and several sisters and brothers. From [place] originally, then lived in [place]. Speaks English well.    Social Determinants of Health   Financial Resource Strain: Not on file  Food Insecurity: Not on file  Transportation Needs: Not on file  Physical Activity: Not on file  Stress: Not on file  Social Connections: Not on file  Intimate Partner Violence: Not on file    FAMILY HISTORY: Family History  Problem Relation Age of Onset   Anemia Mother    Diabetes Father    Anemia Sister    Breast cancer Maternal Aunt 38     ALLERGIES:  is allergic to feraheme [ferumoxytol] and pork-derived products.  MEDICATIONS:  Current Outpatient Medications  Medication Sig Dispense Refill   etonogestrel-ethinyl estradiol (NUVARING) 0.12-0.015 MG/24HR vaginal ring Place 1 each vaginally every 28 (twenty-eight) days. Insert vaginally and leave in place for 3 consecutive weeks, then remove for 1 week. 3 each 3   FeFum-FePo-FA-B Cmp-C-Zn-Mn-Cu (TANDEM PLUS) 162-115.2-1 MG CAPS Take 1 tablet by mouth in the morning and at bedtime. 180 capsule 1   ibuprofen (ADVIL) 200 MG tablet Take 400 mg by mouth every 6 (six) hours as needed.     iron polysaccharides (NIFEREX) 150 MG capsule Take 150 mg by mouth every morning.     nystatin (MYCOSTATIN/NYSTOP) powder Apply 1 Application topically 3 (three) times daily. 15 g 1   NONFORMULARY OR COMPOUNDED ITEM Iron infusion     Relugolix-Estradiol-Norethind (MYFEMBREE) 40-1-0.5 MG TABS Take 1 tablet by mouth daily. 28 tablet 5   No current facility-administered medications for this visit.    REVIEW OF SYSTEMS:    10 Point review of Systems was done is negative except as noted above.  PHYSICAL EXAMINATION: ECOG PERFORMANCE STATUS: 1 - Symptomatic but completely ambulatory  . Vitals:   04/06/22 1249  BP: 111/67  Pulse: 79  Temp: 97.9 F (36.6 C)  SpO2: 98%   Filed Weights   04/06/22 1249  Weight: 221 lb 1.6 oz (100.3 kg)   .Body mass index is 36.79 kg/m. NAD GENERAL:alert, in no acute distress and comfortable SKIN: no acute rashes, no significant lesions EYES: conjunctiva are pink and non-injected, sclera anicteric NECK: supple, no JVD LYMPH:  no palpable lymphadenopathy in the cervical, axillary or inguinal regions LUNGS: clear to auscultation b/l with normal respiratory effort HEART: regular rate & rhythm ABDOMEN:  normoactive bowel sounds , non tender, not distended. Extremity: no pedal edema PSYCH: alert & oriented x 3 with fluent speech NEURO: no focal  motor/sensory deficits  LABORATORY DATA:  I have reviewed the data as listed .    Latest Ref Rng & Units 04/06/2022   12:35 PM 01/04/2022   12:05 PM 12/09/2021   11:28 AM  CBC  WBC 4.0 - 10.5 K/uL 4.9  6.3  6.7   Hemoglobin 12.0 - 15.0 g/dL 12.5  10.1  7.6   Hematocrit 36.0 - 46.0 % 37.3  33.9  25.1   Platelets 150 - 400 K/uL 357  383  408    .    Latest Ref Rng & Units 04/06/2022   12:35 PM 01/04/2022   12:05 PM 04/22/2021    4:01 PM  CMP  Glucose 70 - 99 mg/dL 95  105  107   BUN 6 - 20 mg/dL '8  10  9   '$ Creatinine 0.44 - 1.00 mg/dL 0.62  0.63  0.68   Sodium 135 -  145 mmol/L 140  141  141   Potassium 3.5 - 5.1 mmol/L 3.6  4.0  3.9   Chloride 98 - 111 mmol/L 108  105  100   CO2 22 - 32 mmol/L '27  29  23   '$ Calcium 8.9 - 10.3 mg/dL 9.1  9.1  9.2   Total Protein 6.5 - 8.1 g/dL 7.2  6.9  6.9   Total Bilirubin 0.3 - 1.2 mg/dL 0.3  0.2  0.2   Alkaline Phos 38 - 126 U/L 51  56  64   AST 15 - 41 U/L '12  11  13   '$ ALT 0 - 44 U/L '8  8  5    '$ . Lab Results  Component Value Date   IRON 31 04/06/2022   TIBC 314 04/06/2022   IRONPCTSAT 10 (L) 04/06/2022   (Iron and TIBC)  Lab Results  Component Value Date   FERRITIN 25 04/06/2022   B12 -- 293    RADIOGRAPHIC STUDIES: I have personally reviewed the radiological images as listed and agreed with the findings in the report. No results found.  ASSESSMENT & PLAN:   36 y.o. very pleasant female with  1. 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 CBC  Hgb at 7.6 and RBC count of 3.41. Iron reduced at 13. Labs done 09/30/2021 were also reviewed. Iron low at 16. Iron sat ratio at 4% Ferritin at 21. 11/12/2021 Fecal occult blood negative  Plan -Labs done today were reviewed in detail. CBC stable with Hgb at 12.5 and RBC count of 4.47. CMP unremarkable. Iron at 31. Iron sat ratio at 10% Ferritin at 25 -Patient's chart reviewed in detail -We will get  labs today to evaluate her current status further and for treatment planning. -start a vitamin B complex. -Continue oral iron supplementation. -We discussed that if ferritin less than 20 she will need IV iron.  -She is currently taking iron polysaccharide and she notes she is tolerating it well with no prohibitive toxicities. -We discussed that she will get infusion once more and then afterward she can take the iron polysaccharide 3x weekly Monday, Wednesday, and Friday.  -She has been advised to follow up with PCP.   Follow up: Plz schedule 1 dose of IV Venofer '300mg'$  in the next 1-2 weeks RTC with PCP  .The total time spent in the appointment was 15 minutes* .  All of the patient's questions were answered with apparent satisfaction. The patient knows to call the clinic with any problems, questions or concerns.   Sullivan Lone MD MS AAHIVMS [facility] Hematology/Oncology Physician [facility]  .*Total Encounter Time as defined by the Centers for Medicare and Medicaid Services includes, in addition to the face-to-face time of a patient visit (documented in the note above) non-face-to-face time: obtaining and reviewing outside history, ordering and reviewing medications, tests or procedures, care coordination (communications with other health care professionals or caregivers) and documentation in the medical record.   I, Melene Muller, am acting as scribe for Dr. Sullivan Lone, MD.  .I have reviewed the above documentation for accuracy and completeness, and I agree with the above.Brunetta Genera MD

## 2022-04-07 ENCOUNTER — Encounter: Payer: Self-pay | Admitting: Internal Medicine

## 2022-04-08 ENCOUNTER — Telehealth: Payer: Self-pay | Admitting: Hematology

## 2022-04-08 NOTE — Telephone Encounter (Signed)
Left message with follow-up appointment per 9/5 los.

## 2022-04-09 ENCOUNTER — Telehealth: Payer: Self-pay | Admitting: Hematology

## 2022-04-09 ENCOUNTER — Other Ambulatory Visit: Payer: Self-pay | Admitting: Hematology

## 2022-04-09 NOTE — Telephone Encounter (Signed)
Per 9/8 phone line pt called to r/s appointment

## 2022-04-11 ENCOUNTER — Encounter: Payer: Self-pay | Admitting: Hematology

## 2022-04-12 ENCOUNTER — Inpatient Hospital Stay: Payer: 59

## 2022-04-12 ENCOUNTER — Other Ambulatory Visit: Payer: Self-pay

## 2022-04-12 VITALS — BP 131/77 | HR 95 | Temp 98.8°F | Resp 17

## 2022-04-12 DIAGNOSIS — D500 Iron deficiency anemia secondary to blood loss (chronic): Secondary | ICD-10-CM | POA: Diagnosis not present

## 2022-04-12 DIAGNOSIS — N921 Excessive and frequent menstruation with irregular cycle: Secondary | ICD-10-CM

## 2022-04-12 MED ORDER — ACETAMINOPHEN 325 MG PO TABS
650.0000 mg | ORAL_TABLET | Freq: Once | ORAL | Status: AC
  Administered 2022-04-12: 650 mg via ORAL
  Filled 2022-04-12: qty 2

## 2022-04-12 MED ORDER — METHYLPREDNISOLONE SODIUM SUCC 125 MG IJ SOLR
60.0000 mg | Freq: Once | INTRAMUSCULAR | Status: AC
  Administered 2022-04-12: 60 mg via INTRAVENOUS
  Filled 2022-04-12: qty 2

## 2022-04-12 MED ORDER — SODIUM CHLORIDE 0.9 % IV SOLN
300.0000 mg | Freq: Once | INTRAVENOUS | Status: AC
  Administered 2022-04-12: 300 mg via INTRAVENOUS
  Filled 2022-04-12: qty 300

## 2022-04-12 MED ORDER — LORATADINE 10 MG PO TABS
10.0000 mg | ORAL_TABLET | Freq: Once | ORAL | Status: AC
  Administered 2022-04-12: 10 mg via ORAL
  Filled 2022-04-12: qty 1

## 2022-04-12 MED ORDER — SODIUM CHLORIDE 0.9 % IV SOLN: INTRAVENOUS | Status: DC

## 2022-04-12 NOTE — Progress Notes (Signed)
Pt declined to stay for 30 minute observation.  VSS discharge to lobby

## 2022-04-13 ENCOUNTER — Ambulatory Visit: Payer: 59

## 2022-04-13 ENCOUNTER — Telehealth: Payer: Self-pay | Admitting: *Deleted

## 2022-04-13 NOTE — Telephone Encounter (Signed)
Patient called and left message in triage voicemail asking for return call. I left message for patient to call.

## 2022-04-13 NOTE — Telephone Encounter (Signed)
Patient called back stating mycostatin powder is not helping with skin infection, it appears worse. I advised patient to schedule an office visit for recheck.

## 2022-04-14 ENCOUNTER — Encounter: Payer: Self-pay | Admitting: Nurse Practitioner

## 2022-04-14 ENCOUNTER — Ambulatory Visit: Payer: 59 | Admitting: Nurse Practitioner

## 2022-04-14 VITALS — BP 112/68 | HR 87

## 2022-04-14 DIAGNOSIS — R21 Rash and other nonspecific skin eruption: Secondary | ICD-10-CM

## 2022-04-14 MED ORDER — NYSTATIN-TRIAMCINOLONE 100000-0.1 UNIT/GM-% EX OINT: 1.0000 | TOPICAL_OINTMENT | Freq: Two times a day (BID) | CUTANEOUS | 0 refills | Status: AC

## 2022-04-14 MED ORDER — FLUCONAZOLE 200 MG PO TABS: 200.0000 mg | ORAL_TABLET | ORAL | 0 refills | Status: AC

## 2022-04-14 NOTE — Progress Notes (Signed)
   Acute Office Visit  Subjective:    Patient ID: Kristina Leon, female    DOB: [birth year], 36 y.o.   MRN: [id number]   HPI 36 y.o. presents today for bilateral groin itching and redness. Was seen 01/11/22 with similar complaints provided with Nystatin powder and she used for 1 week. She did notice improvement but then ran out of powder. Symptoms have worsened again. No vaginal symptoms. She reports that she does sweat a lot and underwear becomes moist.    Review of Systems  Constitutional: Negative.   Genitourinary: Negative.   Skin:  Positive for rash (Bilateral groin).       Objective:    Physical Exam Constitutional:      Appearance: Normal appearance.  Genitourinary:      BP 112/68   Pulse 87   SpO2 96%  Wt Readings from Last 3 Encounters:  04/06/22 221 lb 1.6 oz (100.3 kg)  01/11/22 227 lb (103 kg)  01/11/22 227 lb (103 kg)        Patient informed chaperone available to be present for breast and/or pelvic exam. Patient has requested no chaperone to be present. Patient has been advised what will be completed during breast and pelvic exam.   Assessment & Plan:   Problem List Items Addressed This Visit   None Visit Diagnoses     Groin rash    -  Primary   Relevant Medications   fluconazole (DIFLUCAN) 200 MG tablet   nystatin-triamcinolone ointment (MYCOLOG)      Plan: Exam consistent with skin fungal infection, likely did not use Nystatin long enough. Diflucan 200 mg every other day x 14 days, Mycolog ointment to area BID x 14 days. If no improvement or symptoms return, derm referral recommended. Keep area clean and dry.      Tamela Gammon DNP, 11:50 AM 04/14/2022

## 2022-05-11 ENCOUNTER — Encounter: Payer: Self-pay | Admitting: Internal Medicine

## 2022-05-25 ENCOUNTER — Encounter: Payer: 59 | Admitting: Family Medicine

## 2022-05-31 ENCOUNTER — Encounter: Payer: Self-pay | Admitting: Hematology

## 2022-06-01 ENCOUNTER — Encounter: Payer: Self-pay | Admitting: Family Medicine

## 2022-06-01 ENCOUNTER — Ambulatory Visit: Payer: 59 | Admitting: Family Medicine

## 2022-06-01 VITALS — BP 120/72 | HR 68 | Ht 65.0 in | Wt 213.8 lb

## 2022-06-01 DIAGNOSIS — D259 Leiomyoma of uterus, unspecified: Secondary | ICD-10-CM

## 2022-06-01 DIAGNOSIS — D500 Iron deficiency anemia secondary to blood loss (chronic): Secondary | ICD-10-CM

## 2022-06-01 DIAGNOSIS — N920 Excessive and frequent menstruation with regular cycle: Secondary | ICD-10-CM | POA: Diagnosis not present

## 2022-06-01 DIAGNOSIS — G43829 Menstrual migraine, not intractable, without status migrainosus: Secondary | ICD-10-CM | POA: Diagnosis not present

## 2022-06-01 LAB — CBC WITH DIFFERENTIAL/PLATELET
Basophils Absolute: 0 10*3/uL (ref 0.0–0.2)
Basos: 1 %
EOS (ABSOLUTE): 0.2 10*3/uL (ref 0.0–0.4)
Eos: 4 %
Hematocrit: 33.5 % — ABNORMAL LOW (ref 34.0–46.6)
Hemoglobin: 11.1 g/dL (ref 11.1–15.9)
Immature Grans (Abs): 0 10*3/uL (ref 0.0–0.1)
Immature Granulocytes: 0 %
Lymphocytes Absolute: 1.5 10*3/uL (ref 0.7–3.1)
Lymphs: 32 %
MCH: 28 pg (ref 26.6–33.0)
MCHC: 33.1 g/dL (ref 31.5–35.7)
MCV: 84 fL (ref 79–97)
Monocytes Absolute: 0.4 10*3/uL (ref 0.1–0.9)
Monocytes: 9 %
Neutrophils Absolute: 2.7 10*3/uL (ref 1.4–7.0)
Neutrophils: 54 %
Platelets: 454 10*3/uL — ABNORMAL HIGH (ref 150–450)
RBC: 3.97 x10E6/uL (ref 3.77–5.28)
RDW: 13.4 % (ref 11.7–15.4)
WBC: 4.8 10*3/uL (ref 3.4–10.8)

## 2022-06-01 MED ORDER — POLYSACCHARIDE IRON COMPLEX 150 MG PO CAPS: 150.0000 mg | ORAL_CAPSULE | Freq: Every morning | ORAL | 3 refills | Status: DC

## 2022-06-01 NOTE — Progress Notes (Signed)
   Subjective:    Patient ID: Kristina Leon, female    DOB: 11/13/1985, 36 y.o.   MRN: [id number]  HPI She is here for consult concerning headaches as well as iron deficiency.  She states that the headaches usually occur around the time of her menses but describes them as in the mid parietal area, constant with no photophobia or phonophobia.  They usually go away when the period ends.  Usually 800 mg of ibuprofen takes care of the headache.  She also has had difficulty with menorrhagia secondary to fibroids She would like a refill on her iron supplements and says that Niferex works the best.  Review of Systems     Objective:   Physical Exam  Alert and in no distress. Tympanic membranes and canals are normal. Pharyngeal area is normal. Neck is supple without adenopathy or thyromegaly. Cardiac exam shows a regular sinus rhythm without murmurs or gallops. Lungs are clear to auscultation.       Assessment & Plan:  Iron deficiency anemia due to chronic blood loss  Menorrhagia with regular cycle  Uterine leiomyoma, unspecified location  Menstrual migraine without status migrainosus, not intractable

## 2022-06-15 ENCOUNTER — Encounter: Payer: Self-pay | Admitting: Internal Medicine

## 2022-07-05 ENCOUNTER — Telehealth: Payer: Self-pay | Admitting: Family Medicine

## 2022-07-05 DIAGNOSIS — D500 Iron deficiency anemia secondary to blood loss (chronic): Secondary | ICD-10-CM

## 2022-07-05 NOTE — Telephone Encounter (Signed)
Pt had blood work done the end of October and it was recommended for her to do repeat blood work. Is it ok to schedule her?

## 2022-07-09 ENCOUNTER — Other Ambulatory Visit: Payer: 59

## 2022-07-09 DIAGNOSIS — D500 Iron deficiency anemia secondary to blood loss (chronic): Secondary | ICD-10-CM

## 2022-07-09 LAB — CBC WITH DIFFERENTIAL/PLATELET
Basophils Absolute: 0 10*3/uL (ref 0.0–0.2)
Basos: 1 %
EOS (ABSOLUTE): 0.1 10*3/uL (ref 0.0–0.4)
Eos: 2 %
Hematocrit: 36.5 % (ref 34.0–46.6)
Hemoglobin: 11.3 g/dL (ref 11.1–15.9)
Immature Grans (Abs): 0 10*3/uL (ref 0.0–0.1)
Immature Granulocytes: 0 %
Lymphocytes Absolute: 1.9 10*3/uL (ref 0.7–3.1)
Lymphs: 32 %
MCH: 24.6 pg — ABNORMAL LOW (ref 26.6–33.0)
MCHC: 31 g/dL — ABNORMAL LOW (ref 31.5–35.7)
MCV: 80 fL (ref 79–97)
Monocytes Absolute: 0.6 10*3/uL (ref 0.1–0.9)
Monocytes: 11 %
Neutrophils Absolute: 3.2 10*3/uL (ref 1.4–7.0)
Neutrophils: 54 %
Platelets: 484 10*3/uL — ABNORMAL HIGH (ref 150–450)
RBC: 4.59 x10E6/uL (ref 3.77–5.28)
RDW: 13.7 % (ref 11.7–15.4)
WBC: 5.9 10*3/uL (ref 3.4–10.8)

## 2023-01-07 ENCOUNTER — Other Ambulatory Visit: Payer: Self-pay

## 2023-01-07 DIAGNOSIS — D259 Leiomyoma of uterus, unspecified: Secondary | ICD-10-CM

## 2023-01-07 DIAGNOSIS — N920 Excessive and frequent menstruation with regular cycle: Secondary | ICD-10-CM

## 2023-01-07 NOTE — Telephone Encounter (Signed)
TW pt. Pt LVM in triage line stating she needs refills on BC ring.  Last AEX 01/11/2022--scheduled for 03/23/2023  Rx pend.

## 2023-01-10 MED ORDER — ETONOGESTREL-ETHINYL ESTRADIOL 0.12-0.015 MG/24HR VA RING: 1.0000 | VAGINAL_RING | VAGINAL | 0 refills | Status: DC

## 2023-01-13 ENCOUNTER — Ambulatory Visit: Payer: 59 | Admitting: Nurse Practitioner

## 2023-03-23 ENCOUNTER — Encounter: Payer: Self-pay | Admitting: Nurse Practitioner

## 2023-03-23 ENCOUNTER — Ambulatory Visit (INDEPENDENT_AMBULATORY_CARE_PROVIDER_SITE_OTHER): Payer: 59 | Admitting: Nurse Practitioner

## 2023-03-23 ENCOUNTER — Other Ambulatory Visit (HOSPITAL_COMMUNITY)
Admission: RE | Admit: 2023-03-23 | Discharge: 2023-03-23 | Disposition: A | Discharge status: Home / Self Care | Payer: 59 | Source: Ambulatory Visit | Attending: Nurse Practitioner | Admitting: Nurse Practitioner

## 2023-03-23 VITALS — BP 110/72 | HR 68 | Ht 65.0 in | Wt 190.0 lb

## 2023-03-23 DIAGNOSIS — Z01419 Encounter for gynecological examination (general) (routine) without abnormal findings: Secondary | ICD-10-CM | POA: Diagnosis not present

## 2023-03-23 DIAGNOSIS — N920 Excessive and frequent menstruation with regular cycle: Secondary | ICD-10-CM

## 2023-03-23 DIAGNOSIS — D500 Iron deficiency anemia secondary to blood loss (chronic): Secondary | ICD-10-CM

## 2023-03-23 DIAGNOSIS — Z124 Encounter for screening for malignant neoplasm of cervix: Secondary | ICD-10-CM | POA: Diagnosis present

## 2023-03-23 DIAGNOSIS — B372 Candidiasis of skin and nail: Secondary | ICD-10-CM

## 2023-03-23 DIAGNOSIS — D259 Leiomyoma of uterus, unspecified: Secondary | ICD-10-CM

## 2023-03-23 MED ORDER — ETONOGESTREL-ETHINYL ESTRADIOL 0.12-0.015 MG/24HR VA RING
1.0000 | VAGINAL_RING | VAGINAL | 3 refills | Status: DC
Start: 2023-03-23 — End: 2024-03-26

## 2023-03-23 MED ORDER — NYSTATIN 100000 UNIT/GM EX POWD: 1.0000 | Freq: Three times a day (TID) | CUTANEOUS | 1 refills | Status: DC

## 2023-03-23 NOTE — Progress Notes (Signed)
Kristina Leon [birth year] [id number]   History:  37 y.o. G0 presents for annual exam. H/O heavy menses s/t uterine fibroids. Has received iron transfusions in the past. Taking oral supplement daily. Bleeding managed well with Nuvaring but she does experience more headaches with use. Headaches begin about a week before menses and can last a week. Good relief with Ibuprofen. Myfembree recommended but never started by patient. Myomectomy 01/2016. Never been sexually active. Normal pap history. Down 30 pounds with exercise. Switched from third to second shift and feels this has also helped. Has groin itching.   Gynecologic History Patient's last menstrual period was 03/14/2023. Period Cycle (Days): 28 Period Duration (Days): 7-10 Period Pattern: Regular Menstrual Flow: Moderate Menstrual Control: Thin pad Menstrual Control Change Freq (Hours): 3 Dysmenorrhea: (!) Moderate Dysmenorrhea Symptoms: Cramping, Headache Contraception/Family planning: abstinence and NuvaRing vaginal inserts Sexually active: No  Health Maintenance Last Pap: 12/30/2017. Results were: Normal neg HPV, 5-year repeat Last mammogram: Not indicated Last colonoscopy: Not indicated Last Dexa: Not indicated  Past medical history, past surgical history, family history and social history were all reviewed and documented in the EPIC chart. Engaged, arranged marriage, he lives in [place] and war has caused delay. 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   ROS:  A ROS was performed and pertinent positives and negatives are included.  Exam:  Vitals:   03/23/23 0856  BP: 110/72  Pulse: 68  SpO2: 100%  Weight: 190 lb (86.2 kg)  Height: 5\' 5"  (1.651 m)     Body mass index is 31.62 kg/m.  General appearance:  Normal Thyroid:  Symmetrical, normal in size, without palpable masses or nodularity. Respiratory  Auscultation:  Clear without wheezing or rhonchi Cardiovascular  Auscultation:  Regular rate, without rubs, murmurs or  gallops  Edema/varicosities:  Not grossly evident Abdominal  Soft,nontender, without masses, guarding or rebound.  Liver/spleen:  No organomegaly noted  Hernia:  None appreciated  Skin  Inspection:  Grossly normal Breasts: Examined lying and sitting.   Right: Without masses, retractions, nipple discharge or axillary adenopathy.   Left: Without masses, retractions, nipple discharge or axillary adenopathy. Genitourinary   Inguinal/mons:  Normal without inguinal adenopathy  External genitalia:  Redness in bilateral groin c/w yeast  BUS/Urethra/Skene's glands:  Normal  Vagina:  Normal appearing with normal color and discharge, no lesions  Cervix:  Normal appearing without discharge or lesions  Uterus:  14 week fibroid uterus, nontender  Adnexa/parametria:     Rt: Normal in size, without masses or tenderness.   Lt: Normal in size, without masses or tenderness.  Anus and perineum: Normal  Patient informed chaperone available to be present for breast and pelvic exam. Patient has requested no chaperone to be present. Patient has been advised what will be completed during breast and pelvic exam.   Assessment/Plan:  37 y.o. G0 for annual exam.   Well female exam with routine gynecological exam - Education provided on SBEs, importance of preventative screenings, current guidelines, high calcium diet, regular exercise, and multivitamin daily. Labs with PCP.   Screening for cervical cancer - Plan: Cytology - PAP( [place]). Normal pap history.   Iron deficiency anemia due to chronic blood loss - Plan: CBC with Differential/Platelet, Iron, TIBC and Ferritin Panel. H/O iron transfusions, taking daily oral supplement. S/t menses.   Uterine leiomyoma, unspecified location - Plan: etonogestrel-ethinyl estradiol (NUVARING) 0.12-0.015 MG/24HR vaginal ring every 28 days.   Menorrhagia with regular cycle - Plan: etonogestrel-ethinyl estradiol (NUVARING) 0.12-0.015 MG/24HR vaginal ring every  28 days.  Good management but causing headaches. Discussed other options and she wants to continue Nuvaring.   Skin yeast infection - Plan: nystatin (MYCOSTATIN/NYSTOP) powder BID.   Return in 1 year for annual.    Olivia Mackie DNP, 9:27 AM 03/23/2023

## 2023-03-24 LAB — CBC WITH DIFFERENTIAL/PLATELET
Basophils Absolute: 0 10*3/uL (ref 0.0–0.2)
Basos: 1 %
EOS (ABSOLUTE): 0.2 10*3/uL (ref 0.0–0.4)
Eos: 4 %
Hematocrit: 31.4 % — ABNORMAL LOW (ref 34.0–46.6)
Hemoglobin: 8.9 g/dL — ABNORMAL LOW (ref 11.1–15.9)
Immature Grans (Abs): 0 10*3/uL (ref 0.0–0.1)
Immature Granulocytes: 0 %
Lymphocytes Absolute: 1.9 10*3/uL (ref 0.7–3.1)
Lymphs: 35 %
MCH: 19.1 pg — ABNORMAL LOW (ref 26.6–33.0)
MCHC: 28.3 g/dL — ABNORMAL LOW (ref 31.5–35.7)
MCV: 68 fL — ABNORMAL LOW (ref 79–97)
Monocytes Absolute: 0.5 10*3/uL (ref 0.1–0.9)
Monocytes: 8 %
Neutrophils Absolute: 2.9 10*3/uL (ref 1.4–7.0)
Neutrophils: 52 %
Platelets: 448 10*3/uL (ref 150–450)
RBC: 4.65 x10E6/uL (ref 3.77–5.28)
RDW: 19.4 % — ABNORMAL HIGH (ref 11.7–15.4)
WBC: 5.5 10*3/uL (ref 3.4–10.8)

## 2023-03-24 LAB — CYTOLOGY - PAP
Comment: NEGATIVE
Diagnosis: NEGATIVE
High risk HPV: NEGATIVE

## 2023-03-24 LAB — IRON,TIBC AND FERRITIN PANEL
Ferritin: 6 ng/mL — ABNORMAL LOW (ref 15–150)
Iron Saturation: 3 % — CL (ref 15–55)
Iron: 13 ug/dL — ABNORMAL LOW (ref 27–159)
Total Iron Binding Capacity: 379 ug/dL (ref 250–450)
UIBC: 366 ug/dL (ref 131–425)

## 2023-03-28 ENCOUNTER — Telehealth: Payer: Self-pay | Admitting: Hematology

## 2023-03-28 NOTE — Telephone Encounter (Signed)
Patient is aware of scheduled appointment times/dates

## 2023-04-01 ENCOUNTER — Telehealth: Payer: Self-pay | Admitting: *Deleted

## 2023-04-01 NOTE — Telephone Encounter (Signed)
Patient left message stating she is scheduled for OV and labs with Dr. Candise Che on 05/02/23.   Call returned to patient, Left message to call [facility] Triage at [phone], option 4.

## 2023-04-25 NOTE — Telephone Encounter (Signed)
Per review of EPIC, patient is scheduled with Dr. Candise Che on 05/02/23.   Routing to [employer].   Encounter closed.   Cc: Marena Chancy

## 2023-04-29 ENCOUNTER — Other Ambulatory Visit: Payer: Self-pay

## 2023-04-29 DIAGNOSIS — D500 Iron deficiency anemia secondary to blood loss (chronic): Secondary | ICD-10-CM

## 2023-05-02 ENCOUNTER — Inpatient Hospital Stay: Payer: 59 | Admitting: Hematology

## 2023-05-02 ENCOUNTER — Inpatient Hospital Stay: Payer: 59 | Attending: Hematology

## 2023-05-02 VITALS — BP 112/60 | HR 86 | Temp 97.9°F | Resp 20 | Wt 190.1 lb

## 2023-05-02 DIAGNOSIS — E538 Deficiency of other specified B group vitamins: Secondary | ICD-10-CM | POA: Insufficient documentation

## 2023-05-02 DIAGNOSIS — N920 Excessive and frequent menstruation with regular cycle: Secondary | ICD-10-CM | POA: Insufficient documentation

## 2023-05-02 DIAGNOSIS — D500 Iron deficiency anemia secondary to blood loss (chronic): Secondary | ICD-10-CM | POA: Insufficient documentation

## 2023-05-02 LAB — CBC WITH DIFFERENTIAL (CANCER CENTER ONLY)
Abs Immature Granulocytes: 0.01 10*3/uL (ref 0.00–0.07)
Basophils Absolute: 0 10*3/uL (ref 0.0–0.1)
Basophils Relative: 1 %
Eosinophils Absolute: 0.1 10*3/uL (ref 0.0–0.5)
Eosinophils Relative: 2 %
HCT: 34.3 % — ABNORMAL LOW (ref 36.0–46.0)
Hemoglobin: 9.8 g/dL — ABNORMAL LOW (ref 12.0–15.0)
Immature Granulocytes: 0 %
Lymphocytes Relative: 35 %
Lymphs Abs: 2 10*3/uL (ref 0.7–4.0)
MCH: 20.1 pg — ABNORMAL LOW (ref 26.0–34.0)
MCHC: 28.6 g/dL — ABNORMAL LOW (ref 30.0–36.0)
MCV: 70.4 fL — ABNORMAL LOW (ref 80.0–100.0)
Monocytes Absolute: 0.5 10*3/uL (ref 0.1–1.0)
Monocytes Relative: 8 %
Neutro Abs: 3.2 10*3/uL (ref 1.7–7.7)
Neutrophils Relative %: 54 %
Platelet Count: 426 10*3/uL — ABNORMAL HIGH (ref 150–400)
RBC: 4.87 MIL/uL (ref 3.87–5.11)
RDW: 22.5 % — ABNORMAL HIGH (ref 11.5–15.5)
WBC Count: 5.9 10*3/uL (ref 4.0–10.5)
nRBC: 0 % (ref 0.0–0.2)

## 2023-05-02 LAB — CMP (CANCER CENTER ONLY)
ALT: 8 U/L (ref 0–44)
AST: 12 U/L — ABNORMAL LOW (ref 15–41)
Albumin: 3.8 g/dL (ref 3.5–5.0)
Alkaline Phosphatase: 61 U/L (ref 38–126)
Anion gap: 6 (ref 5–15)
BUN: 10 mg/dL (ref 6–20)
CO2: 25 mmol/L (ref 22–32)
Calcium: 8.8 mg/dL — ABNORMAL LOW (ref 8.9–10.3)
Chloride: 107 mmol/L (ref 98–111)
Creatinine: 0.71 mg/dL (ref 0.44–1.00)
GFR, Estimated: >60 mL/min (ref >60)
Glucose, Bld: 93 mg/dL (ref 70–99)
Potassium: 3.8 mmol/L (ref 3.5–5.1)
Sodium: 138 mmol/L (ref 135–145)
Total Bilirubin: 0.4 mg/dL (ref 0.3–1.2)
Total Protein: 7.5 g/dL (ref 6.5–8.1)

## 2023-05-02 LAB — IRON AND IRON BINDING CAPACITY (CC-WL,HP ONLY)
Iron: 19 ug/dL — ABNORMAL LOW (ref 28–170)
Saturation Ratios: 4 % — ABNORMAL LOW (ref 10.4–31.8)
TIBC: 452 ug/dL — ABNORMAL HIGH (ref 250–450)
UIBC: 433 ug/dL (ref 148–442)

## 2023-05-02 LAB — VITAMIN B12: Vitamin B-12: 213 pg/mL (ref 180–914)

## 2023-05-02 LAB — FERRITIN: Ferritin: 6 ng/mL — ABNORMAL LOW (ref 11–307)

## 2023-05-02 NOTE — Progress Notes (Signed)
HEMATOLOGY/ONCOLOGY CLINIC NOTE  Date of Service: 05/02/2023  Patient Care Team: Lexine Baton (Inactive) as PCP - General (Physician Assistant)  CHIEF COMPLAINTS/PURPOSE OF CONSULTATION:  Evaluation and consultation for iron deficiency anemia secondary to chronic blood loss from menorrhagia.  HISTORY OF PRESENTING ILLNESS:  Kristina Leon is a wonderful 37 y.o. female who has been referred to [place] by Jake Shark, PA-C (Inactive) for evaluation and consultation of iron deficiency anemia secondary to chronic blood loss from menorrhagia.   She reports that soon after recent Feraheme IV iron she experienced vomiting. We discussed she may need IV iron and we can consider other preparations such as IV Injectafer.   We discussed starting a vitamin B complex which she was agreeable to.  She notes that her significant blood loss is from menorrhagia.  She notes iron deficiency anemia is common in her family.  She notes mild symptoms of PICA. She notes consuming more ice cream and smoothies which she notes is not as normal for her.  She reports some pedal edema when standing for long periods of time. She has not noticed any of this since 12/11/2021.  No history of blood transfusions.  No other abnormal bleeding. No other new or acute focal symptoms.  Labs done 12/09/2021 were reviewed in detail. CBC stable with Hgb at 7.6 and RBC count of 3.41. Iron reduced at 13. Labs done 09/30/2021 were also reviewed. Iron low at 16. Iron sat ratio at 4% Ferritin at 21. 11/12/2021 Fecal occult blood negative  INTERVAL HISTORY:  Kristina Leon is a 37 y.o. female is here for continued evaluation and management of iron deficiency anemia secondary to chronic blood loss from menorrhagia.   Patient was last seen by me on 04/04/2022 and she was doing well overall.   Patient notes she has been doing well overall since our last visit. She complains of heavy menstrual period, which used  to last for 10 days. She has been following up with her OBGYN and notes they have started on a medication which has been helping her menstrual cycle. Patient notes that her menstrual cycle has been heavy due to Fibroids. She notes her menstrual cycle now lasts for 7 days. She has been taking iron polysaccharides regularly since our last visit.   She has been taking pre-natal supplements. She denies pregnancy at this time.   She denies any new infection issues, fever, chills, night sweats, unexpected weight loss, abnormal bleeding, black stool, blood in stool, nose bleeds, chest pain, back pain, or leg swelling. She does complain of small amount of gum bleeding last week. She has lost 30 lbs since our last visit due to diet change and exercising.   MEDICAL HISTORY:  Past Medical History:  Diagnosis Date   Headache    no aura   Iron deficiency anemia    Kidney stone    2009    SURGICAL HISTORY: Past Surgical History:  Procedure Laterality Date   BREAST SURGERY Right    cyst removed   CYSTECTOMY     BREAST   MYOMECTOMY N/A 01/06/2016   Procedure: MYOMECTOMY-abdominal;  Surgeon: Dara Lords, MD;  Location: [facility];  Service: Gynecology;  Laterality: N/A;    SOCIAL HISTORY: Social History   Socioeconomic History   Marital status: Single    Spouse name: Not on file   Number of children: Not on file   Years of education: Not on file   Highest education level: Not on  file  Occupational History   Not on file  Tobacco Use   Smoking status: Never   Smokeless tobacco: Never  Vaping Use   Vaping status: Never Used  Substance and Sexual Activity   Alcohol use: No    Alcohol/week: 0.0 standard drinks of alcohol   Drug use: No   Sexual activity: Never    Birth control/protection: Patch    Comment: Virgin  Other Topics Concern   Not on file  Social History Narrative   Lives in [place], lives with parents and several sisters and brothers. From [place] originally, then lived in  [place]. Speaks English well.    Social Determinants of Health   Financial Resource Strain: Not on file  Food Insecurity: Not on file  Transportation Needs: Not on file  Physical Activity: Not on file  Stress: Not on file  Social Connections: Not on file  Intimate Partner Violence: Not on file    FAMILY HISTORY: Family History  Problem Relation Age of Onset   Anemia Mother    Diabetes Father    Anemia Sister    Breast cancer Maternal Aunt 78    ALLERGIES:  is allergic to feraheme [ferumoxytol] and pork-derived products.  MEDICATIONS:  Current Outpatient Medications  Medication Sig Dispense Refill   etonogestrel-ethinyl estradiol (NUVARING) 0.12-0.015 MG/24HR vaginal ring Place 1 each vaginally every 28 (twenty-eight) days. Insert vaginally and leave in place for 3 consecutive weeks, then remove for 1 week. 3 each 3   ibuprofen (ADVIL) 200 MG tablet Take 400 mg by mouth every 6 (six) hours as needed.     iron polysaccharides (NIFEREX) 150 MG capsule Take 1 capsule (150 mg total) by mouth every morning. 90 capsule 3   nystatin (MYCOSTATIN/NYSTOP) powder Apply 1 Application topically 3 (three) times daily. 30 g 1   No current facility-administered medications for this visit.    REVIEW OF SYSTEMS:    10 Point review of Systems was done is negative except as noted above.  PHYSICAL EXAMINATION: ECOG PERFORMANCE STATUS: 1 - Symptomatic but completely ambulatory  . Vitals:   05/02/23 0958  BP: 112/60  Pulse: 86  Resp: 20  Temp: 97.9 F (36.6 C)  SpO2: 100%    Filed Weights   05/02/23 0958  Weight: 190 lb 1.6 oz (86.2 kg)    .Body mass index is 31.63 kg/m. NAD GENERAL:alert, in no acute distress and comfortable SKIN: no acute rashes, no significant lesions EYES: conjunctiva are pink and non-injected, sclera anicteric NECK: supple, no JVD LYMPH:  no palpable lymphadenopathy in the cervical, axillary or inguinal regions LUNGS: clear to auscultation b/l with normal  respiratory effort HEART: regular rate & rhythm ABDOMEN:  normoactive bowel sounds , non tender, not distended. Extremity: no pedal edema PSYCH: alert & oriented x 3 with fluent speech NEURO: no focal motor/sensory deficits  LABORATORY DATA:  I have reviewed the data as listed .    Latest Ref Rng & Units 05/02/2023    9:37 AM 03/23/2023    9:30 AM 07/09/2022   11:23 AM  CBC  WBC 4.0 - 10.5 K/uL 5.9  5.5  5.9   Hemoglobin 12.0 - 15.0 g/dL 9.8  8.9  16.1   Hematocrit 36.0 - 46.0 % 34.3  31.4  36.5   Platelets 150 - 400 K/uL 426  448  484    .    Latest Ref Rng & Units 05/02/2023    9:37 AM 04/06/2022   12:35 PM 01/04/2022   12:05 PM  CMP  Glucose 70 - 99 mg/dL 93  95  166   BUN 6 - 20 mg/dL 10  8  10    Creatinine 0.44 - 1.00 mg/dL 0.63  0.16  0.10   Sodium 135 - 145 mmol/L 138  140  141   Potassium 3.5 - 5.1 mmol/L 3.8  3.6  4.0   Chloride 98 - 111 mmol/L 107  108  105   CO2 22 - 32 mmol/L 25  27  29    Calcium 8.9 - 10.3 mg/dL 8.8  9.1  9.1   Total Protein 6.5 - 8.1 g/dL 7.5  7.2  6.9   Total Bilirubin 0.3 - 1.2 mg/dL 0.4  0.3  0.2   Alkaline Phos 38 - 126 U/L 61  51  56   AST 15 - 41 U/L 12  12  11    ALT 0 - 44 U/L 8  8  8      . Lab Results  Component Value Date   IRON 19 (L) 05/02/2023   TIBC 452 (H) 05/02/2023   IRONPCTSAT 4 (L) 05/02/2023   (Iron and TIBC)  Lab Results  Component Value Date   FERRITIN 6 (L) 05/02/2023   . Lab Results  Component Value Date   IRON 13 (L) 03/23/2023   TIBC 379 03/23/2023   IRONPCTSAT 3 (LL) 03/23/2023   (Iron and TIBC)  Lab Results  Component Value Date   FERRITIN 6 (L) 03/23/2023   B12 -- 213    RADIOGRAPHIC STUDIES: I have personally reviewed the radiological images as listed and agreed with the findings in the report. No results found.  ASSESSMENT & PLAN:   37 y.o. very pleasant female with  1. Iron deficiency anemia secondary to chronic blood loss from menorrhagia Continues to have significant Iron  deficiency labs on labs today . Lab Results  Component Value Date   IRON 19 (L) 05/02/2023   TIBC 452 (H) 05/02/2023   IRONPCTSAT 4 (L) 05/02/2023   (Iron and TIBC)  Lab Results  Component Value Date   FERRITIN 6 (L) 05/02/2023    2. B12 deficiency B12 -- 213  PLAN: -Discussed lab results from today, 05/02/2023, in detail with the patient. CBC shows patient is anemic with hemoglobin at 9.8 g/dL and hematocrit of 93.2%, and elevated platelets at 426 K.  CMP is wnl -. Iron labs show continued Iron deficiency with ferritin at 6 and iron saturation at 4%. -Discussed the option of IV Monoferric since patient is anemic and severely iron deficient and is having continued significant menstrual bleeding and is intending to conceive. -Schedule patient for IV Monoferric 1000 mg x 1 dose at [facility] in the next 1 to 2 weeks -would recommend B12 SL daily OTC ongoing -B complex 1 cap MWF for 3 months -Return to clinic with Dr. Candise Che with labs in 4 months   FOLLOW-UP: IV Monoferric 1000 mg x 1 dose at [facility] center in the next 1 to 2 weeks Return to clinic with Dr. Candise Che with labs in 4 months  The total time spent in the appointment was 30 minutes* .  All of the patient's questions were answered with apparent satisfaction. The patient knows to call the clinic with any problems, questions or concerns.   Wyvonnia Lora MD MS AAHIVMS [facility] Hematology/Oncology Physician [facility]  .*Total Encounter Time as defined by the Centers for Medicare and Medicaid Services includes, in addition to the face-to-face time  of a patient visit (documented in the note above) non-face-to-face time: obtaining and reviewing outside history, ordering and reviewing medications, tests or procedures, care coordination (communications with other health care professionals or caregivers) and documentation in the medical record.   I,Param Shah,acting as a Neurosurgeon  for Wyvonnia Lora, MD.,have documented all relevant documentation on the behalf of Wyvonnia Lora, MD,as directed by  Wyvonnia Lora, MD while in the presence of Wyvonnia Lora, MD.

## 2023-05-08 ENCOUNTER — Encounter: Payer: Self-pay | Admitting: Hematology

## 2023-05-12 ENCOUNTER — Telehealth: Payer: Self-pay

## 2023-05-12 ENCOUNTER — Other Ambulatory Visit: Payer: Self-pay

## 2023-05-12 NOTE — Telephone Encounter (Signed)
Dr. Candise Che, This patient has [employer] commerical insurance.  They require the patient to try and fail venofer before they will approve monoferric. If you're ok with venofer, please send that over. Thank you!  Auth Submission: NO AUTH NEEDED Site of care: Site of care: [facility] Payer: [employer] commercial Medication & CPT/J Code(s) submitted: Venofer (Iron Sucrose) J1756 Route of submission (phone, fax, portal): portal Phone # Fax # Auth type: Buy/Bill PB Units/visits requested:  Reference number:  Approval from:  to

## 2023-05-17 ENCOUNTER — Other Ambulatory Visit: Payer: Self-pay | Admitting: Physician Assistant

## 2023-05-26 ENCOUNTER — Ambulatory Visit: Payer: 59

## 2023-05-26 VITALS — BP 122/86 | HR 75 | Temp 98.4°F | Resp 18 | Ht 65.0 in | Wt 191.2 lb

## 2023-05-26 DIAGNOSIS — N920 Excessive and frequent menstruation with regular cycle: Secondary | ICD-10-CM | POA: Diagnosis not present

## 2023-05-26 DIAGNOSIS — D500 Iron deficiency anemia secondary to blood loss (chronic): Secondary | ICD-10-CM | POA: Diagnosis not present

## 2023-05-26 MED ORDER — ACETAMINOPHEN 325 MG PO TABS
650.0000 mg | ORAL_TABLET | Freq: Once | ORAL | Status: AC
  Administered 2023-05-26: 650 mg via ORAL
  Filled 2023-05-26: qty 2

## 2023-05-26 MED ORDER — IRON SUCROSE 300 MG IVPB - SIMPLE MED
300.0000 mg | Freq: Once | Status: AC
  Administered 2023-05-26: 300 mg via INTRAVENOUS

## 2023-05-26 MED ORDER — DIPHENHYDRAMINE HCL 25 MG PO CAPS
25.0000 mg | ORAL_CAPSULE | Freq: Once | ORAL | Status: AC
  Administered 2023-05-26: 25 mg via ORAL
  Filled 2023-05-26: qty 1

## 2023-05-26 NOTE — Patient Instructions (Signed)
Iron Sucrose Injection What is this medication? IRON SUCROSE (EYE ern SOO krose) treats low levels of iron (iron deficiency anemia) in people with kidney disease. Iron is a mineral that plays an important role in making red blood cells, which carry oxygen from your lungs to the rest of your body. This medicine may be used for other purposes; ask your health care provider or pharmacist if you have questions. COMMON BRAND NAME(S): Venofer What should I tell my care team before I take this medication? They need to know if you have any of these conditions: Anemia not caused by low iron levels Heart disease High levels of iron in the blood Kidney disease Liver disease An unusual or allergic reaction to iron, other medications, foods, dyes, or preservatives Pregnant or trying to get pregnant Breastfeeding How should I use this medication? This medication is for infusion into a vein. It is given in a hospital or clinic setting. Talk to your care team about the use of this medication in children. While this medication may be prescribed for children as young as 2 years for selected conditions, precautions do apply. Overdosage: If you think you have taken too much of this medicine contact a poison control center or emergency room at once. NOTE: This medicine is only for you. Do not share this medicine with others. What if I miss a dose? Keep appointments for follow-up doses. It is important not to miss your dose. Call your care team if you are unable to keep an appointment. What may interact with this medication? Do not take this medication with any of the following: Deferoxamine Dimercaprol Other iron products This medication may also interact with the following: Chloramphenicol Deferasirox This list may not describe all possible interactions. Give your health care provider a list of all the medicines, herbs, non-prescription drugs, or dietary supplements you use. Also tell them if you smoke,  drink alcohol, or use illegal drugs. Some items may interact with your medicine. What should I watch for while using this medication? Visit your care team regularly. Tell your care team if your symptoms do not start to get better or if they get worse. You may need blood work done while you are taking this medication. You may need to follow a special diet. Talk to your care team. Foods that contain iron include: whole grains/cereals, dried fruits, beans, or peas, leafy green vegetables, and organ meats (liver, kidney). What side effects may I notice from receiving this medication? Side effects that you should report to your care team as soon as possible: Allergic reactions--skin rash, itching, hives, swelling of the face, lips, tongue, or throat Low blood pressure--dizziness, feeling faint or lightheaded, blurry vision Shortness of breath Side effects that usually do not require medical attention (report to your care team if they continue or are bothersome): Flushing Headache Joint pain Muscle pain Nausea Pain, redness, or irritation at injection site This list may not describe all possible side effects. Call your doctor for medical advice about side effects. You may report side effects to FDA at 1-800-FDA-1088. Where should I keep my medication? This medication is given in a hospital or clinic. It will not be stored at home. NOTE: This sheet is a summary. It may not cover all possible information. If you have questions about this medicine, talk to your doctor, pharmacist, or health care provider.  2024 Elsevier/Gold Standard (2022-12-24 00:00:00)

## 2023-05-26 NOTE — Progress Notes (Signed)
Diagnosis: Iron Deficiency Anemia  Provider:  Chilton Greathouse MD  Procedure: IV Infusion  IV Type: Peripheral, IV Location: R Antecubital  Venofer (Iron Sucrose), Dose: 300 mg  Infusion Start Time: 1403  Infusion Stop Time: 1541  Post Infusion IV Care: Patient declined observation and Peripheral IV Discontinued  Discharge: Condition: Good, Destination: Home . AVS Provided  Performed by:  Adriana Mccallum, RN

## 2023-06-06 ENCOUNTER — Ambulatory Visit: Payer: 59

## 2023-06-06 VITALS — BP 113/68 | HR 84 | Temp 98.6°F | Resp 18 | Ht 65.0 in | Wt 190.4 lb

## 2023-06-06 DIAGNOSIS — D500 Iron deficiency anemia secondary to blood loss (chronic): Secondary | ICD-10-CM | POA: Diagnosis not present

## 2023-06-06 DIAGNOSIS — N920 Excessive and frequent menstruation with regular cycle: Secondary | ICD-10-CM | POA: Diagnosis not present

## 2023-06-06 MED ORDER — DIPHENHYDRAMINE HCL 25 MG PO CAPS
25.0000 mg | ORAL_CAPSULE | Freq: Once | ORAL | Status: AC
  Administered 2023-06-06: 25 mg via ORAL
  Filled 2023-06-06: qty 1

## 2023-06-06 MED ORDER — IRON SUCROSE 300 MG IVPB - SIMPLE MED
300.0000 mg | Freq: Once | Status: AC
  Administered 2023-06-06: 300 mg via INTRAVENOUS
  Filled 2023-06-06: qty 265

## 2023-06-06 MED ORDER — ACETAMINOPHEN 325 MG PO TABS
650.0000 mg | ORAL_TABLET | Freq: Once | ORAL | Status: AC
  Administered 2023-06-06: 650 mg via ORAL
  Filled 2023-06-06: qty 2

## 2023-06-06 NOTE — Progress Notes (Signed)
Diagnosis: Iron Deficiency Anemia  Provider:  Chilton Greathouse MD  Procedure: IV Infusion  IV Type: Peripheral, IV Location: L Antecubital  Venofer (Iron Sucrose), Dose: 300 mg  Infusion Start Time: 1023  Infusion Stop Time: 1205  Post Infusion IV Care: Patient declined observation and Peripheral IV Discontinued  Discharge: Condition: Good, Destination: Home . AVS Declined  Performed by:  Rico Ala, LPN

## 2023-06-12 ENCOUNTER — Other Ambulatory Visit: Payer: Self-pay | Admitting: Family Medicine

## 2023-06-12 DIAGNOSIS — D500 Iron deficiency anemia secondary to blood loss (chronic): Secondary | ICD-10-CM

## 2023-06-13 ENCOUNTER — Ambulatory Visit: Payer: 59

## 2023-06-13 VITALS — BP 137/85 | HR 67 | Temp 98.7°F | Resp 14 | Ht 65.0 in | Wt 189.2 lb

## 2023-06-13 DIAGNOSIS — N920 Excessive and frequent menstruation with regular cycle: Secondary | ICD-10-CM | POA: Diagnosis not present

## 2023-06-13 DIAGNOSIS — D500 Iron deficiency anemia secondary to blood loss (chronic): Secondary | ICD-10-CM

## 2023-06-13 MED ORDER — EPINEPHRINE 0.3 MG/0.3ML IJ SOAJ
0.3000 mg | Freq: Once | INTRAMUSCULAR | Status: DC | PRN
Start: 2023-06-13 — End: 2023-06-13

## 2023-06-13 MED ORDER — SODIUM CHLORIDE 0.9% FLUSH: 10.0000 mL | Freq: Once | INTRAVENOUS | Status: DC | PRN

## 2023-06-13 MED ORDER — IRON SUCROSE 300 MG IVPB - SIMPLE MED
300.0000 mg | Freq: Once | Status: AC
  Administered 2023-06-13: 300 mg via INTRAVENOUS
  Filled 2023-06-13: qty 265

## 2023-06-13 MED ORDER — SODIUM CHLORIDE 0.9 % IV SOLN: Freq: Once | INTRAVENOUS | Status: DC | PRN

## 2023-06-13 MED ORDER — ACETAMINOPHEN 325 MG PO TABS: 650.0000 mg | ORAL_TABLET | Freq: Once | ORAL | Status: DC

## 2023-06-13 MED ORDER — ALTEPLASE 2 MG IJ SOLR: 2.0000 mg | Freq: Once | INTRAMUSCULAR | Status: DC | PRN

## 2023-06-13 MED ORDER — METHYLPREDNISOLONE SODIUM SUCC 125 MG IJ SOLR: 125.0000 mg | Freq: Once | INTRAMUSCULAR | Status: DC | PRN

## 2023-06-13 MED ORDER — ALBUTEROL SULFATE HFA 108 (90 BASE) MCG/ACT IN AERS: 2.0000 | INHALATION_SPRAY | Freq: Once | RESPIRATORY_TRACT | Status: DC | PRN

## 2023-06-13 MED ORDER — SODIUM CHLORIDE 0.9% FLUSH: 3.0000 mL | Freq: Once | INTRAVENOUS | Status: DC | PRN

## 2023-06-13 MED ORDER — DIPHENHYDRAMINE HCL 25 MG PO CAPS
25.0000 mg | ORAL_CAPSULE | Freq: Once | ORAL | Status: DC
Start: 2023-06-13 — End: 2023-06-13

## 2023-06-13 MED ORDER — ANTICOAGULANT SODIUM CITRATE 4% (200MG/5ML) IV SOLN
5.0000 mL | Freq: Once | Status: DC | PRN
  Filled 2023-06-13: qty 5

## 2023-06-13 MED ORDER — DIPHENHYDRAMINE HCL 50 MG/ML IJ SOLN: 50.0000 mg | Freq: Once | INTRAMUSCULAR | Status: DC | PRN

## 2023-06-13 MED ORDER — FAMOTIDINE IN NACL 20-0.9 MG/50ML-% IV SOLN: 20.0000 mg | Freq: Once | INTRAVENOUS | Status: DC | PRN

## 2023-06-13 NOTE — Progress Notes (Signed)
Diagnosis: Iron Deficiency Anemia  Provider:  Chilton Greathouse MD  Procedure: IV Infusion  IV Type: Peripheral, IV Location: L Antecubital  Venofer (Iron Sucrose), Dose: 300 mg  Infusion Start Time: 1008  Infusion Stop Time: 1200  Post Infusion IV Care: Patient declined observation and Peripheral IV Discontinued  Discharge: Condition: Good, Destination: Home . AVS Declined  Performed by:  Rico Ala, LPN

## 2023-08-08 ENCOUNTER — Encounter: Payer: Self-pay | Admitting: Medical

## 2023-08-08 ENCOUNTER — Ambulatory Visit: Payer: 59 | Admitting: Medical

## 2023-08-08 VITALS — BP 112/70 | HR 77 | Temp 98.2°F | Wt 194.2 lb

## 2023-08-08 DIAGNOSIS — H9313 Tinnitus, bilateral: Secondary | ICD-10-CM | POA: Diagnosis not present

## 2023-08-08 DIAGNOSIS — H6993 Unspecified Eustachian tube disorder, bilateral: Secondary | ICD-10-CM | POA: Diagnosis not present

## 2023-08-08 MED ORDER — FLUTICASONE PROPIONATE 50 MCG/ACT NA SUSP: 2.0000 | Freq: Every day | NASAL | 0 refills | Status: DC

## 2023-08-08 MED ORDER — PSEUDOEPHEDRINE HCL 60 MG PO TABS: 60.0000 mg | ORAL_TABLET | Freq: Two times a day (BID) | ORAL | 0 refills | Status: DC

## 2023-08-08 NOTE — Progress Notes (Signed)
 Subjective:  Kristina Leon is a 38 y.o. female who presents for Chief Complaint  Patient presents with   Ear Pain    Bell ringing in ear, pain, popping, One week. No recent travel.      Here for ringing in both ears, popping sensation.   Been having symptoms 1 week.   Having some mild pain.   No cough, no sore throat, no runny nose.  Sometime sneezing.   Not using anything for symptoms.  No sick contacts. No other aggravating or relieving factors.    No other c/o.  Getting [place] 09/2023 in [place].    The following portions of the patient's history were reviewed and updated as appropriate: allergies, current medications, past family history, past medical history, past social history, past surgical history and problem list.  ROS Otherwise as in subjective above  Past Medical History:  Diagnosis Date   Headache    no aura   Iron  deficiency anemia    Kidney stone    2009   Current Outpatient Medications on File Prior to Visit  Medication Sig Dispense Refill   etonogestrel -ethinyl estradiol  (NUVARING) 0.12-0.015 MG/24HR vaginal ring Place 1 each vaginally every 28 (twenty-eight) days. Insert vaginally and leave in place for 3 consecutive weeks, then remove for 1 week. 3 each 3   ibuprofen  (ADVIL ) 200 MG tablet Take 400 mg by mouth every 6 (six) hours as needed.     iron  polysaccharides (NIFEREX) 150 MG capsule TAKE 1 CAPSULE(150 MG) BY MOUTH EVERY MORNING 90 capsule 1   nystatin  (MYCOSTATIN /NYSTOP ) powder Apply 1 Application topically 3 (three) times daily. 30 g 1   No current facility-administered medications on file prior to visit.     Objective: BP 112/70   Pulse 77   Temp 98.2 F (36.8 C) (Oral)   Wt 194 lb 3.2 oz (88.1 kg)   LMP 07/19/2023   SpO2 99%   BMI 32.32 kg/m   General appearance: alert, no distress, well developed, well nourished HEENT: normocephalic, sclerae anicteric, conjunctiva pink and moist, TMs flat with some air fluid levels, no erythema, nares  patent, no discharge or erythema, pharynx normal Oral cavity: MMM, no lesions Neck: supple, no lymphadenopathy, no thyromegaly, no masses Lungs: CTA bilaterally, no wheezes, rhonchi, or rales    Assessment: Encounter Diagnoses  Name Primary?   Dysfunction of both eustachian tubes Yes   Tinnitus of both ears      Plan: Eustachian tube dysfunction  Recommendations; Begin Flonase  nasal spray daily in the morning for 7-10 days to reduce inflammation in the sinuses Begin Sudafed 60mg  twice daily for 5-7 days for congestion/pressure in sinuses and behind ears Continue good water intake If not much improved by Thursday, email or call . Sometimes we add antibiotic ear drop when not improving   Charles was seen today for ear pain.  Diagnoses and all orders for this visit:  Dysfunction of both eustachian tubes  Tinnitus of both ears  Other orders -     pseudoephedrine  (SUDAFED) 60 MG tablet; Take 1 tablet (60 mg total) by mouth in the morning and at bedtime. -     fluticasone  (FLONASE ) 50 MCG/ACT nasal spray; Place 2 sprays into both nostrils daily.    Follow up: prn

## 2023-08-08 NOTE — Patient Instructions (Signed)
 Eustachian tube dysfunction  Recommendations; Begin Flonase  nasal spray daily in the morning for 7-10 days to reduce inflammation in the sinuses Begin Sudafed 60mg  twice daily for 5-7 days for congestion/pressure in sinuses and behind ears Continue good water intake If not much improved by Thursday, email or call . Sometimes we add antibiotic ear drop when not improving     Barotitis Media Barotitis media is soreness (inflammation) of the area behind the eardrum (middle ear). This occurs when the auditory tube (Eustachian tube) leading from the back of the throat to the eardrum is blocked. When it is blocked air cannot move in and out of the middle ear to equalize pressure changes. These pressure changes come from changes in altitude when: Flying.  Driving in the mountains.  Diving.  Problems are more likely to occur with pressure changes during times when you are congested as from: Hay fever.  Upper respiratory infection.  A cold.  Damage or hearing loss (barotrauma) caused by this may be permanent. HOME CARE INSTRUCTIONS  Use medicines as recommended by your caregiver. Over the counter medicines will help unblock the canal and can help during times of air travel.  Do not put anything into your ears to clean or unplug them. Eardrops will not be helpful.  Do not swim, dive, or fly until your caregiver says it is all right to do so. If these activities are necessary, chewing gum with frequent swallowing may help. It is also helpful to hold your nose and gently blow to pop your ears for equalizing pressure changes. This forces air into the Eustachian tube.  For little ones with problems, give your baby a bottle of water or juice during periods when pressure changes would be anticipated such as during take offs and landings associated with air travel.  Only take over-the-counter or prescription medicines for pain, discomfort, or fever as directed by your caregiver.  A decongestant may be  helpful in de-congesting the middle ear and make pressure equalization easier. This can be even more effective if the drops (spray) are delivered with the head lying over the edge of a bed with the head tilted toward the ear on the affected side.  If your caregiver has given you a follow-up appointment, it is very important to keep that appointment. Not keeping the appointment could result in a chronic or permanent injury, pain, hearing loss and disability. If there is any problem keeping the appointment, you must call back to this facility for assistance.  SEEK IMMEDIATE MEDICAL CARE IF:  You develop a severe headache, dizziness, severe ear pain, or bloody or pus-like drainage from your ears.  An oral temperature above 102 F (38.9 C) develops.  Your problems do not improve or become worse.  MAKE SURE YOU:  Understand these instructions.  Will watch your condition.  Will get help right away if you are not doing well or get worse.  Document Released: 07/16/2000 Document Revised: 03/31/2011 Document Reviewed: 02/22/2008 [facility] Patient Information 2012 [place], [place].

## 2023-08-16 ENCOUNTER — Telehealth: Payer: Self-pay | Admitting: Nurse Practitioner

## 2023-08-16 ENCOUNTER — Other Ambulatory Visit: Payer: Self-pay | Admitting: Medical

## 2023-08-16 MED ORDER — CIPROFLOXACIN-FLUOCINOLONE PF 0.3-0.025 % OT SOLN: 0.2500 mL | Freq: Two times a day (BID) | OTIC | 0 refills | Status: DC

## 2023-08-16 NOTE — Telephone Encounter (Signed)
 Pt called and states ear is not any better, may be worse  ? Call in rx

## 2023-08-31 NOTE — Progress Notes (Incomplete)
HEMATOLOGY/ONCOLOGY CLINIC NOTE  Date of Service: 09/02/2023  Patient Care Team: Early, Sung Amabile, NP as PCP - General (Nurse Practitioner)  CHIEF COMPLAINTS/PURPOSE OF CONSULTATION:  Evaluation and consultation for iron deficiency anemia secondary to chronic blood loss from menorrhagia.  HISTORY OF PRESENTING ILLNESS:  Kristina Leon is a wonderful 38 y.o. female who has been referred to [place] by Early, Sung Amabile, NP for evaluation and consultation of iron deficiency anemia secondary to chronic blood loss from menorrhagia.   She reports that soon after recent Feraheme IV iron she experienced vomiting. We discussed she may need IV iron and we can consider other preparations such as IV Injectafer.   We discussed starting a vitamin B complex which she was agreeable to.  She notes that her significant blood loss is from menorrhagia.  She notes iron deficiency anemia is common in her family.  She notes mild symptoms of PICA. She notes consuming more ice cream and smoothies which she notes is not as normal for her.  She reports some pedal edema when standing for long periods of time. She has not noticed any of this since 12/11/2021.  No history of blood transfusions.  No other abnormal bleeding. No other new or acute focal symptoms.  Labs done 12/09/2021 were reviewed in detail. CBC stable with Hgb at 7.6 and RBC count of 3.41. Iron reduced at 13. Labs done 09/30/2021 were also reviewed. Iron low at 16. Iron sat ratio at 4% Ferritin at 21. 11/12/2021 Fecal occult blood negative  INTERVAL HISTORY:  Kristina Leon is a 38 y.o. female is here for continued evaluation and management of iron deficiency anemia secondary to chronic blood loss from menorrhagia.   Patient was last seen by me on 05/02/2023 and complained of heavy menstrual period and minimal gum bleeding. She also reported a 30-lb weight loss due to diet change and exercising.   Today, she reports that she has tolerated IV  iron well with no toxicity issues. She reports significant improvement in energy levels. She reports that she is taking oral iron daily which she tolerating well with no toxicities.  Patient reports that she did not have significant menstrual blood loss with her last two menstrual cycles. She reports that her menstrual cycles previously lasted 7 days, but have decreased to 3-5 days more recently. She continues to follow with her OB/GYN for management of her menstrual cycles.   She continues to take vitamin B12 daily and vitamin B complex three times a week.   Patient reports no new concerns since her last clinical visit. She denies any change in bowel habits, urinary issues, leg swelling, or abdominal pain.  MEDICAL HISTORY:  Past Medical History:  Diagnosis Date   Headache    no aura   Iron deficiency anemia    Kidney stone    2009    SURGICAL HISTORY: Past Surgical History:  Procedure Laterality Date   BREAST SURGERY Right    cyst removed   CYSTECTOMY     BREAST   MYOMECTOMY N/A 01/06/2016   Procedure: MYOMECTOMY-abdominal;  Surgeon: Dara Lords, MD;  Location: [facility];  Service: Gynecology;  Laterality: N/A;    SOCIAL HISTORY: Social History   Socioeconomic History   Marital status: Single    Spouse name: Not on file   Number of children: Not on file   Years of education: Not on file   Highest education level: Not on file  Occupational History   Not on file  Tobacco Use   Smoking status: Never   Smokeless tobacco: Never  Vaping Use   Vaping status: Never Used  Substance and Sexual Activity   Alcohol use: No    Alcohol/week: 0.0 standard drinks of alcohol   Drug use: No   Sexual activity: Never    Birth control/protection: Patch    Comment: Virgin  Other Topics Concern   Not on file  Social History Narrative   Lives in [place], lives with parents and several sisters and brothers. From [place] originally, then lived in [place]. Speaks English well.     Social Drivers of Corporate investment banker Strain: Not on file  Food Insecurity: Not on file  Transportation Needs: Not on file  Physical Activity: Not on file  Stress: Not on file  Social Connections: Not on file  Intimate Partner Violence: Not on file    FAMILY HISTORY: Family History  Problem Relation Age of Onset   Anemia Mother    Diabetes Father    Anemia Sister    Breast cancer Maternal Aunt 36    ALLERGIES:  is allergic to feraheme [ferumoxytol] and pork-derived products.  MEDICATIONS:  Current Outpatient Medications  Medication Sig Dispense Refill   ciprofloxacin-fluocinolone PF (OTOVEL) 0.3-0.025 % SOLN Place 0.25 mLs into both ears 2 (two) times daily. 15 mL 0   etonogestrel-ethinyl estradiol (NUVARING) 0.12-0.015 MG/24HR vaginal ring Place 1 each vaginally every 28 (twenty-eight) days. Insert vaginally and leave in place for 3 consecutive weeks, then remove for 1 week. 3 each 3   fluticasone (FLONASE) 50 MCG/ACT nasal spray Place 2 sprays into both nostrils daily. 16 g 0   ibuprofen (ADVIL) 200 MG tablet Take 400 mg by mouth every 6 (six) hours as needed.     iron polysaccharides (NIFEREX) 150 MG capsule TAKE 1 CAPSULE(150 MG) BY MOUTH EVERY MORNING 90 capsule 1   nystatin (MYCOSTATIN/NYSTOP) powder Apply 1 Application topically 3 (three) times daily. 30 g 1   pseudoephedrine (SUDAFED) 60 MG tablet Take 1 tablet (60 mg total) by mouth in the morning and at bedtime. 14 tablet 0   No current facility-administered medications for this visit.    REVIEW OF SYSTEMS:    10 Point review of Systems was done is negative except as noted above.   PHYSICAL EXAMINATION: ECOG PERFORMANCE STATUS: 1 - Symptomatic but completely ambulatory  . There were no vitals filed for this visit.   There were no vitals filed for this visit.   .There is no height or weight on file to calculate BMI.   GENERAL:alert, in no acute distress and comfortable SKIN: no acute rashes,  no significant lesions EYES: conjunctiva are pink and non-injected, sclera anicteric OROPHARYNX: MMM, no exudates, no oropharyngeal erythema or ulceration NECK: supple, no JVD LYMPH:  no palpable lymphadenopathy in the cervical, axillary or inguinal regions LUNGS: clear to auscultation b/l with normal respiratory effort HEART: regular rate & rhythm ABDOMEN:  normoactive bowel sounds , non tender, not distended. Extremity: no pedal edema PSYCH: alert & oriented x 3 with fluent speech NEURO: no focal motor/sensory deficits   LABORATORY DATA:  I have reviewed the data as listed .    Latest Ref Rng & Units 05/02/2023    9:37 AM 03/23/2023    9:30 AM 07/09/2022   11:23 AM  CBC  WBC 4.0 - 10.5 K/uL 5.9  5.5  5.9   Hemoglobin 12.0 - 15.0 g/dL 9.8  8.9  16.1   Hematocrit 36.0 - 46.0 %  34.3  31.4  36.5   Platelets 150 - 400 K/uL 426  448  484    .    Latest Ref Rng & Units 05/02/2023    9:37 AM 04/06/2022   12:35 PM 01/04/2022   12:05 PM  CMP  Glucose 70 - 99 mg/dL 93  95  960   BUN 6 - 20 mg/dL 10  8  10    Creatinine 0.44 - 1.00 mg/dL 4.54  0.98  1.19   Sodium 135 - 145 mmol/L 138  140  141   Potassium 3.5 - 5.1 mmol/L 3.8  3.6  4.0   Chloride 98 - 111 mmol/L 107  108  105   CO2 22 - 32 mmol/L 25  27  29    Calcium 8.9 - 10.3 mg/dL 8.8  9.1  9.1   Total Protein 6.5 - 8.1 g/dL 7.5  7.2  6.9   Total Bilirubin 0.3 - 1.2 mg/dL 0.4  0.3  0.2   Alkaline Phos 38 - 126 U/L 61  51  56   AST 15 - 41 U/L 12  12  11    ALT 0 - 44 U/L 8  8  8      . Lab Results  Component Value Date   IRON 19 (L) 05/02/2023   TIBC 452 (H) 05/02/2023   IRONPCTSAT 4 (L) 05/02/2023   (Iron and TIBC)  Lab Results  Component Value Date   FERRITIN 6 (L) 05/02/2023   . Lab Results  Component Value Date   IRON 19 (L) 05/02/2023   TIBC 452 (H) 05/02/2023   IRONPCTSAT 4 (L) 05/02/2023   (Iron and TIBC)  Lab Results  Component Value Date   FERRITIN 6 (L) 05/02/2023   B12 -- 213    RADIOGRAPHIC  STUDIES: I have personally reviewed the radiological images as listed and agreed with the findings in the report. No results found.  ASSESSMENT & PLAN:   38 y.o. very pleasant female with  1. 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 Lab Results  Component Value Date   IRON 19 (L) 05/02/2023   TIBC 452 (H) 05/02/2023   IRONPCTSAT 4 (L) 05/02/2023   (Iron and TIBC)  Lab Results  Component Value Date   FERRITIN 6 (L) 05/02/2023    2. B12 deficiency B12 -- 213  PLAN:  -Discussed lab results on 09/02/2023 in detail with patient. CBC showed WBC of 6.5K, hemoglobin of 13.4, and platelets of 390K. -hgb normalized from 9s four months ago to 13.4 currently -other labs including iron labs are pending at this time -continue B12 SL daily OTC ongoing -continue B complex 1 capsule MWF -continue oral iron for maintence -there may be a role for repeat IV iron as needed -will continue to monitor with labs in 6 months to ensure her iron level does not drop -after 6 months, she may be discahrged to her PCP to continue labs every 6 months  FOLLOW-UP: Return to clinic with Dr. Candise Che with labs in 6 months  The total time spent in the appointment was *** minutes* .  All of the patient's questions were answered with apparent satisfaction. The patient knows to call the clinic with any problems, questions or concerns.   Wyvonnia Lora MD MS AAHIVMS [facility] Hematology/Oncology Physician [facility]  .*Total Encounter Time as defined by the Centers for Medicare and Medicaid Services includes, in  addition to the face-to-face time of a patient visit (documented in the note above) non-face-to-face time: obtaining and reviewing outside history, ordering and reviewing medications, tests or procedures, care coordination (communications with other health care professionals or caregivers)  and documentation in the medical record.    I,Mitra Faeizi,acting as a Neurosurgeon for Wyvonnia Lora, MD.,have documented all relevant documentation on the behalf of Wyvonnia Lora, MD,as directed by  Wyvonnia Lora, MD while in the presence of Wyvonnia Lora, MD.  ***

## 2023-09-01 ENCOUNTER — Other Ambulatory Visit: Payer: Self-pay

## 2023-09-01 ENCOUNTER — Telehealth: Payer: Self-pay | Admitting: Nurse Practitioner

## 2023-09-01 DIAGNOSIS — E538 Deficiency of other specified B group vitamins: Secondary | ICD-10-CM

## 2023-09-01 DIAGNOSIS — D500 Iron deficiency anemia secondary to blood loss (chronic): Secondary | ICD-10-CM

## 2023-09-01 MED ORDER — CIPROFLOXACIN-FLUOCINOLONE PF 0.3-0.025 % OT SOLN: 0.2500 mL | Freq: Two times a day (BID) | OTIC | 0 refills | Status: DC

## 2023-09-01 NOTE — Telephone Encounter (Signed)
Pt req ear drops be sent to different pharmacy [facility] [address]

## 2023-09-01 NOTE — Telephone Encounter (Signed)
done

## 2023-09-02 ENCOUNTER — Inpatient Hospital Stay: Payer: 59 | Admitting: Hematology

## 2023-09-02 ENCOUNTER — Inpatient Hospital Stay: Payer: 59 | Attending: Hematology

## 2023-09-02 VITALS — BP 114/78 | HR 81 | Temp 97.6°F | Resp 16 | Ht 65.0 in | Wt 193.5 lb

## 2023-09-02 DIAGNOSIS — N920 Excessive and frequent menstruation with regular cycle: Secondary | ICD-10-CM | POA: Diagnosis present

## 2023-09-02 DIAGNOSIS — E538 Deficiency of other specified B group vitamins: Secondary | ICD-10-CM

## 2023-09-02 DIAGNOSIS — D500 Iron deficiency anemia secondary to blood loss (chronic): Secondary | ICD-10-CM | POA: Insufficient documentation

## 2023-09-02 LAB — CBC WITH DIFFERENTIAL (CANCER CENTER ONLY)
Abs Immature Granulocytes: 0.01 10*3/uL (ref 0.00–0.07)
Basophils Absolute: 0 10*3/uL (ref 0.0–0.1)
Basophils Relative: 1 %
Eosinophils Absolute: 0.2 10*3/uL (ref 0.0–0.5)
Eosinophils Relative: 3 %
HCT: 40.3 % (ref 36.0–46.0)
Hemoglobin: 13.4 g/dL (ref 12.0–15.0)
Immature Granulocytes: 0 %
Lymphocytes Relative: 34 %
Lymphs Abs: 2.2 10*3/uL (ref 0.7–4.0)
MCH: 28.5 pg (ref 26.0–34.0)
MCHC: 33.3 g/dL (ref 30.0–36.0)
MCV: 85.7 fL (ref 80.0–100.0)
Monocytes Absolute: 0.4 10*3/uL (ref 0.1–1.0)
Monocytes Relative: 7 %
Neutro Abs: 3.7 10*3/uL (ref 1.7–7.7)
Neutrophils Relative %: 55 %
Platelet Count: 390 10*3/uL (ref 150–400)
RBC: 4.7 MIL/uL (ref 3.87–5.11)
RDW: 14.4 % (ref 11.5–15.5)
WBC Count: 6.5 10*3/uL (ref 4.0–10.5)
nRBC: 0 % (ref 0.0–0.2)

## 2023-09-02 LAB — CMP (CANCER CENTER ONLY)
ALT: 7 U/L (ref 0–44)
AST: 11 U/L — ABNORMAL LOW (ref 15–41)
Albumin: 3.8 g/dL (ref 3.5–5.0)
Alkaline Phosphatase: 55 U/L (ref 38–126)
Anion gap: 7 (ref 5–15)
BUN: 10 mg/dL (ref 6–20)
CO2: 26 mmol/L (ref 22–32)
Calcium: 8.7 mg/dL — ABNORMAL LOW (ref 8.9–10.3)
Chloride: 104 mmol/L (ref 98–111)
Creatinine: 0.72 mg/dL (ref 0.44–1.00)
GFR, Estimated: >60 mL/min (ref >60)
Glucose, Bld: 89 mg/dL (ref 70–99)
Potassium: 3.9 mmol/L (ref 3.5–5.1)
Sodium: 137 mmol/L (ref 135–145)
Total Bilirubin: 0.5 mg/dL (ref 0.0–1.2)
Total Protein: 7.4 g/dL (ref 6.5–8.1)

## 2023-09-02 LAB — FERRITIN: Ferritin: 32 ng/mL (ref 11–307)

## 2023-09-02 LAB — VITAMIN B12: Vitamin B-12: 880 pg/mL (ref 180–914)

## 2023-09-02 LAB — IRON AND IRON BINDING CAPACITY (CC-WL,HP ONLY)
Iron: 57 ug/dL (ref 28–170)
Saturation Ratios: 16 % (ref 10.4–31.8)
TIBC: 350 ug/dL (ref 250–450)
UIBC: 293 ug/dL (ref 148–442)

## 2023-09-04 ENCOUNTER — Other Ambulatory Visit: Payer: Self-pay | Admitting: Medical

## 2023-09-05 ENCOUNTER — Telehealth: Payer: Self-pay

## 2023-09-05 NOTE — Telephone Encounter (Signed)
States her insurance will not cover otovel ear drops and it is over $300 a month. Would like an alternative sent in.

## 2023-09-06 ENCOUNTER — Other Ambulatory Visit: Payer: Self-pay | Admitting: Nurse Practitioner

## 2023-09-06 DIAGNOSIS — H60333 Swimmer's ear, bilateral: Secondary | ICD-10-CM

## 2023-09-06 MED ORDER — HYDROCORTISONE-ACETIC ACID 1-2 % OT SOLN: 3.0000 [drp] | Freq: Two times a day (BID) | OTIC | 0 refills | Status: DC

## 2023-09-26 ENCOUNTER — Other Ambulatory Visit: Payer: Self-pay

## 2023-09-26 DIAGNOSIS — D500 Iron deficiency anemia secondary to blood loss (chronic): Secondary | ICD-10-CM

## 2023-09-26 MED ORDER — POLYSACCHARIDE IRON COMPLEX 150 MG PO CAPS: 150.0000 mg | ORAL_CAPSULE | Freq: Every day | ORAL | 1 refills | Status: DC

## 2023-11-22 ENCOUNTER — Other Ambulatory Visit: Payer: Self-pay

## 2023-11-22 DIAGNOSIS — B372 Candidiasis of skin and nail: Secondary | ICD-10-CM

## 2023-11-22 MED ORDER — NYSTATIN-TRIAMCINOLONE 100000-0.1 UNIT/GM-% EX OINT: 1.0000 | TOPICAL_OINTMENT | Freq: Two times a day (BID) | CUTANEOUS | 1 refills | Status: DC

## 2023-11-22 MED ORDER — NYSTATIN 100000 UNIT/GM EX POWD: 1.0000 | Freq: Three times a day (TID) | CUTANEOUS | 1 refills | Status: AC

## 2023-11-22 NOTE — Telephone Encounter (Signed)
 Med refill request: nystatin  triamcinolone  cream and nystatin  powder Message left on RF line.  Spoke to patient, patient complains of rash in crease of legs, used both nystatin  triamcinolone  cream and nystatin  powder in the past. She uses one in the daytime and the other at night. Desires refills. Last AEX: 03/23/23 Next AEX: 03/26/24 Last MMG (if hormonal med) n/a Refill authorized: nystatin -triamcinolone  cream and nystatin  powder Please approve or deny as appropriate.

## 2023-12-29 ENCOUNTER — Encounter: Payer: 59 | Admitting: Family Medicine

## 2024-01-20 ENCOUNTER — Ambulatory Visit: Admitting: Nurse Practitioner

## 2024-01-20 ENCOUNTER — Encounter: Payer: Self-pay | Admitting: Nurse Practitioner

## 2024-01-20 VITALS — BP 116/72 | HR 92 | Ht 65.5 in | Wt 197.6 lb

## 2024-01-20 DIAGNOSIS — N920 Excessive and frequent menstruation with regular cycle: Secondary | ICD-10-CM

## 2024-01-20 DIAGNOSIS — G8929 Other chronic pain: Secondary | ICD-10-CM

## 2024-01-20 DIAGNOSIS — D500 Iron deficiency anemia secondary to blood loss (chronic): Secondary | ICD-10-CM | POA: Diagnosis not present

## 2024-01-20 DIAGNOSIS — N179 Acute kidney failure, unspecified: Secondary | ICD-10-CM

## 2024-01-20 DIAGNOSIS — R7989 Other specified abnormal findings of blood chemistry: Secondary | ICD-10-CM | POA: Diagnosis not present

## 2024-01-20 DIAGNOSIS — Z1159 Encounter for screening for other viral diseases: Secondary | ICD-10-CM

## 2024-01-20 DIAGNOSIS — Z0000 Encounter for general adult medical examination without abnormal findings: Secondary | ICD-10-CM

## 2024-01-20 DIAGNOSIS — E66811 Obesity, class 1: Secondary | ICD-10-CM

## 2024-01-20 DIAGNOSIS — E669 Obesity, unspecified: Secondary | ICD-10-CM

## 2024-01-20 DIAGNOSIS — M25562 Pain in left knee: Secondary | ICD-10-CM

## 2024-01-20 DIAGNOSIS — Z6832 Body mass index (BMI) 32.0-32.9, adult: Secondary | ICD-10-CM

## 2024-01-20 LAB — LIPID PANEL

## 2024-01-20 NOTE — Patient Instructions (Addendum)
 We can get you set up for further discussion about weight once we have your labs back so we can make sure we have all of the details. I will check to make sure your blood sugar is not running higher, especially with a family history of diabetes. We will also check for insulin resistance, which can really make weight loss difficult.  If you are interested in starting any kind of weight loss medication, I recommend that you contact your insurance company to determine if they cover weight loss medications and what would be covered.  The 2 medications that are injectable are Wegovy and Zepbound. If insurance does not cover these, it is ok, we can work with alternatives.   We will plan to set up a time to go over the labs and discuss medications that may be helpful to give you the boost you need while you work on weight loss with diet and exercise.    I have sent in the referral to sports medicine for your knee. They will call you very soon to schedule an evaluation.    WEIGHT LOSS PLANNING Your progress today shows:     01/20/2024    8:25 AM 09/02/2023    9:06 AM 08/08/2023   10:17 AM  Vitals with BMI  Height 5' 5.5 5' 5   Weight 197 lbs 10 oz 193 lbs 8 oz 194 lbs 3 oz  BMI 32.37 32.2   Systolic 116 114 295  Diastolic 72 78 70  Pulse 92 81 77    For best management of weight, it is vital to balance intake versus output. This means the number of calories burned per day must be less than the calories you take in with food and drink.   I recommend trying to follow a diet with the following: Calories: 1600-1800 calories per day Carbohydrates: 150-180 grams of carbohydrates per day  Why: Gives your body enough quick fuel for cells to maintain normal function without sending them into starvation mode.  Protein: At least 90 grams of protein per day- 30 grams with each meal Why: Protein takes longer and uses more energy than carbohydrates to break down for fuel. The carbohydrates in your meals  serves as quick energy sources and proteins help use some of that extra quick energy to break down to produce long term energy. This helps you not feel hungry as quickly and protein breakdown burns calories.  Water: Drink AT LEAST 64 ounces of water per day  Why: Water is essential to healthy metabolism. Water helps to fill the stomach and keep you fuller longer. Water is required for healthy digestion and filtering of waste in the body.  Fat: Limit fats in your diet- when choosing fats, choose foods with lower fats content such as lean meats (chicken, fish, [place]).  Why: Increased fat intake leads to storage for later. Once you burn your carbohydrate energy, your body goes into fat and protein breakdown mode to help you loose weight.  Cholesterol: Fats and oils that are LIQUID at room temperature are best. Choose vegetable oils (olive oil, avocado oil, nuts). Avoid fats that are SOLID at room temperature (animal fats, processed meats). Healthy fats are often found in whole grains, beans, nuts, seeds, and berries.  Why: Elevated cholesterol levels lead to build up of cholesterol on the inside of your blood vessels. This will eventually cause the blood vessels to become hard and can lead to high blood pressure and damage to your organs. When the blood  flow is reduced, but the pressure is high from cholesterol buildup, parts of the cholesterol can break off and form clots that can go to the brain or heart leading to a stroke or heart attack.  Fiber: Increase amount of SOLUBLE the fiber in your diet. This helps to fill you up, lowers cholesterol, and helps with digestion. Some foods high in soluble fiber are oats, peas, beans, apples, carrots, barley, and citrus fruits.   Why: Fiber fills you up, helps remove excess cholesterol, and aids in healthy digestion which are all very important in weight management.   I recommend the following as a minimum activity routine: Purposeful walk or other physical  activity at least 20 minutes every single day. This means purposefully taking a walk, jog, bike, swim, treadmill, elliptical, dance, etc.  This activity should be ABOVE your normal daily activities, such as walking at work. Goal exercise should be at least 150 minutes a week- work your way up to this.   Heart Rate: Your maximum exercise heart rate should be 220 - Your Age in Years. When exercising, get your heart rate up, but avoid going over the maximum targeted heart rate.  60-70% of your maximum heart rate is where you tend to burn the most fat. To find this number:  220 - Age In Years= Max HR  Max HR x 0.6 (or 0.7) = Fat Burning HR The Fat Burning HR is your goal heart rate while working out to burn the most fat.  NEVER exercise to the point your feel lightheaded, weak, nauseated, dizzy. If you experience ANY of these symptoms- STOP exercise! Allow yourself to cool down and your heart rate to come down. Then restart slower next time.  If at ANY TIME you feel chest pain or chest pressure during exercise, STOP IMMEDIATELY and seek medical attention.

## 2024-01-20 NOTE — Assessment & Plan Note (Signed)
 Repeat thyroid labs for monitoring. Considering weight difficulties and heavy menses, it is possible this is a concern.

## 2024-01-20 NOTE — Assessment & Plan Note (Signed)
 Repeat labs present.

## 2024-01-20 NOTE — Assessment & Plan Note (Signed)
 Heavy menstrual bleeding previously lasting ten days, improved with Doreen, now reducing by the fifth day. Hemoglobin levels need rechecking to assess for anemia. - Check hemoglobin levels to assess for anemia

## 2024-01-20 NOTE — Assessment & Plan Note (Signed)

## 2024-01-20 NOTE — Assessment & Plan Note (Signed)
 Recheck labs today. No concerns.

## 2024-01-20 NOTE — Assessment & Plan Note (Signed)
 Chronic left knee pain exacerbated by exercise, possibly due to meniscus micro tears causing swelling and crepitus. Previous therapy was ineffective. Differential includes meniscus micro tears or other structural issues. Referral to orthopedics for further evaluation and management is recommended. Ultrasound may be used for diagnosis, which is less invasive than MRI. Potential treatments include bracing or direct injection to reduce inflammation. - Refer to sports medicine for evaluation and management of left knee pain - Consider ultrasound to assess for meniscus tears - Advise continuation of ice and ibuprofen  for symptom management

## 2024-01-20 NOTE — Progress Notes (Signed)
 Dell Fennel, DNP, AGNP-c [facility] [address] [place], [place] [address] Main Office [phone]  BP 116/72   Pulse 92   Ht 5' 5.5 (1.664 m)   Wt 197 lb 9.6 oz (89.6 kg)   LMP 01/10/2024   BMI 32.38 kg/m    Subjective:    Patient ID: Kristina Leon, female    DOB: 22-Apr-1986, 38 y.o.   MRN: [id number]  HPI: Kristina Leon is a 38 y.o. female presenting on 01/20/2024 for comprehensive medical examination.   History of Present Illness Kristina Leon is a 38 year old female who presents for her annual exam.  She has concerns with left knee pain exacerbated by exercise and difficulty losing weight.   She has been experiencing left knee pain for a while, which worsens with exercise. The pain is located behind the kneecap and is accompanied by a creaky sound when bending the knee. She experiences pain when walking up stairs but not when descending. She has tried exercises, ibuprofen , and icing the knee without significant relief. Previous therapy did not change her condition.  She exercises almost every day, primarily walking, and has been doing so for almost two years. Despite regular exercise and dietary monitoring, she struggles with weight loss, having reduced her weight from over 200 pounds to the 190s. She is concerned about possible insulin resistance or thyroid issues, especially given her father's diabetes. She avoids sugar and excessive carbs in her diet.  She reports heavy menstrual periods, which have improved with the use of NuvaRing, reducing from ten days of heavy flow to lighter flow by the fifth day. She last checked her hemoglobin in January after receiving infusions. No changes in bowel or bladder habits, stating they are normal.  Pertinent items are noted in HPI.  Most Recent Depression Screen:     01/20/2024    8:24 AM 05/26/2023    1:27 PM 09/30/2021   10:37 AM 04/22/2021    3:07 PM 05/02/2020    2:33 PM  Depression screen PHQ 2/9  Decreased  Interest 0 0 0 0 0  Down, Depressed, Hopeless 0 0 0 0 0  PHQ - 2 Score 0 0 0 0 0   Most Recent Anxiety Screen:      No data to display         Most Recent Fall Screen:    01/20/2024    8:24 AM 05/26/2023    1:28 PM 05/26/2023    1:27 PM 09/30/2021   10:37 AM 04/22/2021    3:07 PM  Fall Risk   Falls in the past year? 0 0 0 0 0  Number falls in past yr: 0   0 0  Injury with Fall? 0   0 0  Risk for fall due to : No Fall Risks No Fall Risks No Fall Risks No Fall Risks No Fall Risks  Follow up Falls evaluation completed Falls evaluation completed;Education provided;Falls prevention discussed Falls evaluation completed;Education provided;Falls prevention discussed Falls evaluation completed  Falls evaluation completed      Data saved with a previous flowsheet row definition    Past medical history, surgical history, medications, allergies, family history and social history reviewed with patient today and changes made to appropriate areas of the chart.  Past Medical History:  Past Medical History:  Diagnosis Date   Headache    no aura   Iron  deficiency anemia    Kidney stone    2009   Menorrhagia with irregular cycle  05/02/2020   Medications:  Current Outpatient Medications on File Prior to Visit  Medication Sig   etonogestrel -ethinyl estradiol  (NUVARING) 0.12-0.015 MG/24HR vaginal ring Place 1 each vaginally every 28 (twenty-eight) days. Insert vaginally and leave in place for 3 consecutive weeks, then remove for 1 week.   fluticasone  (FLONASE ) 50 MCG/ACT nasal spray SHAKE LIQUID AND USE 2 SPRAYS IN EACH NOSTRIL DAILY   ibuprofen  (ADVIL ) 200 MG tablet Take 400 mg by mouth every 6 (six) hours as needed.   iron  polysaccharides (NIFEREX) 150 MG capsule Take 1 capsule (150 mg total) by mouth daily.   nystatin  (MYCOSTATIN /NYSTOP ) powder Apply 1 Application topically 3 (three) times daily.   nystatin -triamcinolone  ointment (MYCOLOG) Apply 1 Application topically 2 (two) times daily.    No current facility-administered medications on file prior to visit.   Surgical History:  Past Surgical History:  Procedure Laterality Date   BREAST SURGERY Right    cyst removed   CYSTECTOMY     BREAST   MYOMECTOMY N/A 01/06/2016   Procedure: MYOMECTOMY-abdominal;  Surgeon: Lacretia Piccolo, MD;  Location: [facility];  Service: Gynecology;  Laterality: N/A;   Allergies:  Allergies  Allergen Reactions   Feraheme  [Ferumoxytol ] Nausea And Vomiting, Other (See Comments) and Cough    Chest and abdominal pain    Pork-Derived Products     Pt is ARABIC  WANTS NO PORK  Or gellatin    Family History:  Family History  Problem Relation Age of Onset   Anemia Mother    Diabetes Father    Anemia Sister    Breast cancer Maternal Aunt 48       Objective:    BP 116/72   Pulse 92   Ht 5' 5.5 (1.664 m)   Wt 197 lb 9.6 oz (89.6 kg)   LMP 01/10/2024   BMI 32.38 kg/m   Wt Readings from Last 3 Encounters:  01/20/24 197 lb 9.6 oz (89.6 kg)  09/02/23 193 lb 8 oz (87.8 kg)  08/08/23 194 lb 3.2 oz (88.1 kg)    Physical Exam Vitals and nursing note reviewed.  Constitutional:      General: She is not in acute distress.    Appearance: Normal appearance.  HENT:     Head: Normocephalic and atraumatic.     Right Ear: Hearing, tympanic membrane, ear canal and external ear normal.     Left Ear: Hearing, tympanic membrane, ear canal and external ear normal.     Nose: Nose normal.     Right Sinus: No maxillary sinus tenderness or frontal sinus tenderness.     Left Sinus: No maxillary sinus tenderness or frontal sinus tenderness.     Mouth/Throat:     Lips: Pink.     Mouth: Mucous membranes are moist.     Pharynx: Oropharynx is clear.   Eyes:     General: Lids are normal. Vision grossly intact.     Extraocular Movements: Extraocular movements intact.     Conjunctiva/sclera: Conjunctivae normal.     Pupils: Pupils are equal, round, and reactive to light.     Funduscopic exam:    Right  eye: Red reflex present.        Left eye: Red reflex present.    Visual Fields: Right eye visual fields normal and left eye visual fields normal.   Neck:     Thyroid: No thyromegaly.     Vascular: No carotid bruit.   Cardiovascular:     Rate and Rhythm: Normal rate and regular  rhythm.     Chest Wall: PMI is not displaced.     Pulses: Normal pulses.          Dorsalis pedis pulses are 2+ on the right side and 2+ on the left side.       Posterior tibial pulses are 2+ on the right side and 2+ on the left side.     Heart sounds: Normal heart sounds. No murmur heard. Pulmonary:     Effort: Pulmonary effort is normal. No respiratory distress.     Breath sounds: Normal breath sounds.  Abdominal:     General: Abdomen is flat. Bowel sounds are normal. There is no distension.     Palpations: Abdomen is soft. There is no hepatomegaly, splenomegaly or mass.     Tenderness: There is no abdominal tenderness. There is no right CVA tenderness, left CVA tenderness, guarding or rebound.   Musculoskeletal:        General: Swelling and tenderness present.     Cervical back: Full passive range of motion without pain, normal range of motion and neck supple. No tenderness.     Right lower leg: No edema.     Left lower leg: No edema.       Legs:  Feet:     Left foot:     Toenail Condition: Left toenails are normal.  Lymphadenopathy:     Cervical: No cervical adenopathy.     Upper Body:     Right upper body: No supraclavicular adenopathy.     Left upper body: No supraclavicular adenopathy.   Skin:    General: Skin is warm and dry.     Capillary Refill: Capillary refill takes less than 2 seconds.     Nails: There is no clubbing.   Neurological:     General: No focal deficit present.     Mental Status: She is alert and oriented to person, place, and time.     GCS: GCS eye subscore is 4. GCS verbal subscore is 5. GCS motor subscore is 6.     Sensory: Sensation is intact.     Motor: Motor function  is intact.     Coordination: Coordination is intact.     Gait: Gait is intact.     Deep Tendon Reflexes: Reflexes are normal and symmetric.   Psychiatric:        Attention and Perception: Attention normal.        Mood and Affect: Mood normal.        Speech: Speech normal.        Behavior: Behavior normal. Behavior is cooperative.        Thought Content: Thought content normal.        Cognition and Memory: Cognition and memory normal.        Judgment: Judgment normal.      Results for orders placed or performed in visit on 09/02/23  Vitamin B12   Collection Time: 09/02/23  8:38 AM  Result Value Ref Range   Vitamin B-12 880 180 - 914 pg/mL  Iron  and Iron  Binding Capacity ([facility] only)   Collection Time: 09/02/23  8:38 AM  Result Value Ref Range   Iron  57 28 - 170 ug/dL   TIBC 409 811 - 914 ug/dL   Saturation Ratios 16 10.4 - 31.8 %   UIBC 293 148 - 442 ug/dL  CMP ([facility] only)   Collection Time: 09/02/23  8:38 AM  Result Value Ref Range   Sodium 137 135 -  145 mmol/L   Potassium 3.9 3.5 - 5.1 mmol/L   Chloride 104 98 - 111 mmol/L   CO2 26 22 - 32 mmol/L   Glucose, Bld 89 70 - 99 mg/dL   BUN 10 6 - 20 mg/dL   Creatinine 1.61 0.96 - 1.00 mg/dL   Calcium 8.7 (L) 8.9 - 10.3 mg/dL   Total Protein 7.4 6.5 - 8.1 g/dL   Albumin 3.8 3.5 - 5.0 g/dL   AST 11 (L) 15 - 41 U/L   ALT 7 0 - 44 U/L   Alkaline Phosphatase 55 38 - 126 U/L   Total Bilirubin 0.5 0.0 - 1.2 mg/dL   GFR, Estimated >04 >54 mL/min   Anion gap 7 5 - 15  CBC with Differential ([facility] Only)   Collection Time: 09/02/23  8:38 AM  Result Value Ref Range   WBC Count 6.5 4.0 - 10.5 K/uL   RBC 4.70 3.87 - 5.11 MIL/uL   Hemoglobin 13.4 12.0 - 15.0 g/dL   HCT 09.8 11.9 - 14.7 %   MCV 85.7 80.0 - 100.0 fL   MCH 28.5 26.0 - 34.0 pg   MCHC 33.3 30.0 - 36.0 g/dL   RDW 82.9 56.2 - 13.0 %   Platelet Count 390 150 - 400 K/uL   nRBC 0.0 0.0 - 0.2 %   Neutrophils Relative % 55 %   Neutro Abs 3.7 1.7 -  7.7 K/uL   Lymphocytes Relative 34 %   Lymphs Abs 2.2 0.7 - 4.0 K/uL   Monocytes Relative 7 %   Monocytes Absolute 0.4 0.1 - 1.0 K/uL   Eosinophils Relative 3 %   Eosinophils Absolute 0.2 0.0 - 0.5 K/uL   Basophils Relative 1 %   Basophils Absolute 0.0 0.0 - 0.1 K/uL   Immature Granulocytes 0 %   Abs Immature Granulocytes 0.01 0.00 - 0.07 K/uL  Ferritin   Collection Time: 09/02/23  8:39 AM  Result Value Ref Range   Ferritin 32 11 - 307 ng/mL       Assessment & Plan:   Problem List Items Addressed This Visit     Acute kidney injury (HCC)   Recheck labs today. No concerns.      Relevant Orders   CBC with Differential/Platelet   CMP14+EGFR   Iron  deficiency anemia due to chronic blood loss   Repeat labs present.       Relevant Orders   CBC with Differential/Platelet   Iron , TIBC and Ferritin Panel   Abnormal thyroid blood test   Repeat thyroid labs for monitoring. Considering weight difficulties and heavy menses, it is possible this is a concern.       Relevant Orders   Thyroid Cascade Profile   Menorrhagia with regular cycle   Heavy menstrual bleeding previously lasting ten days, improved with Kristina Leon, now reducing by the fifth day. Hemoglobin levels need rechecking to assess for anemia. - Check hemoglobin levels to assess for anemia      Encounter for annual physical exam - Primary   CPE completed today. Review of HM activities and recommendations discussed and provided on AVS. Anticipatory guidance, diet, and exercise recommendations provided. Medications, allergies, and hx reviewed and updated as necessary. Orders placed as listed below.  Plan: - Labs ordered. Will make changes as necessary based on results.  - I will review these results and send recommendations via MyChart or a telephone call.  - F/U with CPE in 1 year or sooner for acute/chronic health needs as directed.  Class 1 obesity without serious comorbidity with body mass index (BMI) of 32.0 to  32.9 in adult   Difficulty losing weight despite regular exercise and dietary monitoring. Possible insulin resistance suspected. Previous weight loss noted but plateaued. Consideration of insulin resistance or thyroid dysfunction as underlying causes. Discussion of potential pharmacological interventions if lab results indicate insulin resistance or thyroid issues. Metformin may help with both weight management and menstrual regulation. Phentermine and other medications discussed, with insurance coverage considerations. - Check A1c and insulin levels to assess for insulin resistance - Check thyroid function tests - Discuss potential use of metformin, phentermine, or other weight management medications based on lab results and insurance coverage - Advise her to contact insurance regarding coverage for weight management medications      Chronic pain of left knee   Chronic left knee pain exacerbated by exercise, possibly due to meniscus micro tears causing swelling and crepitus. Previous therapy was ineffective. Differential includes meniscus micro tears or other structural issues. Referral to orthopedics for further evaluation and management is recommended. Ultrasound may be used for diagnosis, which is less invasive than MRI. Potential treatments include bracing or direct injection to reduce inflammation. - Refer to sports medicine for evaluation and management of left knee pain - Consider ultrasound to assess for meniscus tears - Advise continuation of ice and ibuprofen  for symptom management      Relevant Orders   Ambulatory referral to Sports Medicine   Other Visit Diagnoses       Obesity (BMI 30-39.9)       Relevant Orders   CMP14+EGFR   Hemoglobin A1c   Lipid panel   Insulin, random     Encounter for hepatitis C screening test for low risk patient       Relevant Orders   Hepatitis C antibody         Follow up plan: Return for 2-3 wk virtual- Labs/Weight, 1 year CPE.  NEXT  PREVENTATIVE PHYSICAL DUE IN 1 YEAR.  PATIENT COUNSELING PROVIDED FOR ALL ADULT PATIENTS: A well balanced diet low in saturated fats, cholesterol, and moderation in carbohydrates.  This can be as simple as monitoring portion sizes and cutting back on sugary beverages such as soda and juice to start with.    Daily water consumption of at least 64 ounces.  Physical activity at least 180 minutes per week.  If just starting out, start 10 minutes a day and work your way up.   This can be as simple as taking the stairs instead of the elevator and walking 2-3 laps around the office  purposefully every day.   STD protection, partner selection, and regular testing if high risk.  Limited consumption of alcoholic beverages if alcohol is consumed. For men, I recommend no more than 14 alcoholic beverages per week, spread out throughout the week (max 2 per day). Avoid binge drinking or consuming large quantities of alcohol in one setting.  Please let me know if you feel you may need help with reduction or quitting alcohol consumption.   Avoidance of nicotine, if used. Please let me know if you feel you may need help with reduction or quitting nicotine use.   Daily mental health attention. This can be in the form of 5 minute daily meditation, prayer, journaling, yoga, reflection, etc.  Purposeful attention to your emotions and mental state can significantly improve your overall wellbeing  and  Health.  Please know that I am here to help you with all of your health  care goals and am happy to work with you to find a solution that works best for you.  The greatest advice I have received with any changes in life are to take it one step at a time, that even means if all you can focus on is the next 60 seconds, then do that and celebrate your victories.  With any changes in life, you will have set backs, and that is OK. The important thing to remember is, if you have a set back, it is not a failure, it is  an opportunity to try again! Screening Testing Mammogram Every 1 -2 years based on history and risk factors Starting at age 55 Pap Smear Ages 21-39 every 3 years Ages 72-65 every 5 years with HPV testing More frequent testing may be required based on results and history Colon Cancer Screening Every 1-10 years based on test performed, risk factors, and history Starting at age 69 Bone Density Screening Every 2-10 years based on history Starting at age 1 for women Recommendations for men differ based on medication usage, history, and risk factors AAA Screening One time ultrasound Men 53-80 years old who have every smoked Lung Cancer Screening Low Dose Lung CT every 12 months Age 9-80 years with a 30 pack-year smoking history who still smoke or who have quit within the last 15 years   Screening Labs Routine  Labs: Complete Blood Count (CBC), Complete Metabolic Panel (CMP), Cholesterol (Lipid Panel) Every 6-12 months based on history and medications May be recommended more frequently based on current conditions or previous results Hemoglobin A1c Lab Every 3-12 months based on history and previous results Starting at age 3 or earlier with diagnosis of diabetes, high cholesterol, BMI >26, and/or risk factors Frequent monitoring for patients with diabetes to ensure blood sugar control Thyroid Panel (TSH) Every 6 months based on history, symptoms, and risk factors May be repeated more often if on medication HIV One time testing for all patients 41 and older May be repeated more frequently for patients with increased risk factors or exposure Hepatitis C One time testing for all patients 42 and older May be repeated more frequently for patients with increased risk factors or exposure Gonorrhea, Chlamydia Every 12 months for all sexually active persons 13-24 years Additional monitoring may be recommended for those who are considered high risk or who have symptoms Every 12 months  for any woman on birth control, regardless of sexual activity PSA Men 21-53 years old with risk factors Additional screening may be recommended from age 38-69 based on risk factors, symptoms, and history  Vaccine Recommendations Tetanus Booster All adults every 10 years Flu Vaccine All patients 6 months and older every year COVID Vaccine All patients 12 years and older Initial dosing with booster May recommend additional booster based on age and health history HPV Vaccine 2 doses all patients age 65-26 Dosing may be considered for patients over 26 Shingles Vaccine (Shingrix) 2 doses all adults 55 years and older Pneumonia (Pneumovax 65) All adults 65 years and older May recommend earlier dosing based on health history One year apart from Prevnar 19 Pneumonia (Prevnar 62) All adults 65 years and older Dosed 1 year after Pneumovax 23 Pneumonia (Prevnar 20) One time alternative to the two dosing of 13 and 23 For all adults with initial dose of 23, 20 is recommended 1 year later For all adults with initial dose of 13, 23 is still recommended as second option 1 year later

## 2024-01-20 NOTE — Assessment & Plan Note (Signed)
 Difficulty losing weight despite regular exercise and dietary monitoring. Possible insulin resistance suspected. Previous weight loss noted but plateaued. Consideration of insulin resistance or thyroid dysfunction as underlying causes. Discussion of potential pharmacological interventions if lab results indicate insulin resistance or thyroid issues. Metformin may help with both weight management and menstrual regulation. Phentermine and other medications discussed, with insurance coverage considerations. - Check A1c and insulin levels to assess for insulin resistance - Check thyroid function tests - Discuss potential use of metformin, phentermine, or other weight management medications based on lab results and insurance coverage - Advise her to contact insurance regarding coverage for weight management medications

## 2024-01-21 LAB — CMP14+EGFR
ALT: 13 IU/L (ref 0–32)
AST: 19 IU/L (ref 0–40)
Albumin: 4 g/dL (ref 3.9–4.9)
Alkaline Phosphatase: 72 IU/L (ref 44–121)
BUN/Creatinine Ratio: 17 (ref 9–23)
BUN: 12 mg/dL (ref 6–20)
Bilirubin Total: 0.3 mg/dL (ref 0.0–1.2)
CO2: 20 mmol/L (ref 20–29)
Calcium: 9 mg/dL (ref 8.7–10.2)
Chloride: 104 mmol/L (ref 96–106)
Creatinine, Ser: 0.72 mg/dL (ref 0.57–1.00)
Globulin, Total: 3.1 g/dL (ref 1.5–4.5)
Glucose: 87 mg/dL (ref 70–99)
Potassium: 4.3 mmol/L (ref 3.5–5.2)
Sodium: 140 mmol/L (ref 134–144)
Total Protein: 7.1 g/dL (ref 6.0–8.5)
eGFR: 110 mL/min/{1.73_m2} (ref >59)

## 2024-01-21 LAB — CBC WITH DIFFERENTIAL/PLATELET
Basophils Absolute: 0 10*3/uL (ref 0.0–0.2)
Basos: 1 %
EOS (ABSOLUTE): 0.2 10*3/uL (ref 0.0–0.4)
Eos: 3 %
Hematocrit: 40.5 % (ref 34.0–46.6)
Hemoglobin: 13.1 g/dL (ref 11.1–15.9)
Immature Grans (Abs): 0 10*3/uL (ref 0.0–0.1)
Immature Granulocytes: 0 %
Lymphocytes Absolute: 2.2 10*3/uL (ref 0.7–3.1)
Lymphs: 42 %
MCH: 28.8 pg (ref 26.6–33.0)
MCHC: 32.3 g/dL (ref 31.5–35.7)
MCV: 89 fL (ref 79–97)
Monocytes Absolute: 0.4 10*3/uL (ref 0.1–0.9)
Monocytes: 8 %
Neutrophils Absolute: 2.4 10*3/uL (ref 1.4–7.0)
Neutrophils: 46 %
Platelets: 378 10*3/uL (ref 150–450)
RBC: 4.55 x10E6/uL (ref 3.77–5.28)
RDW: 13.6 % (ref 11.7–15.4)
WBC: 5.3 10*3/uL (ref 3.4–10.8)

## 2024-01-21 LAB — IRON,TIBC AND FERRITIN PANEL
Ferritin: 31 ng/mL (ref 15–150)
Iron Saturation: 11 % — ABNORMAL LOW (ref 15–55)
Iron: 36 ug/dL (ref 27–159)
Total Iron Binding Capacity: 315 ug/dL (ref 250–450)
UIBC: 279 ug/dL (ref 131–425)

## 2024-01-21 LAB — HEMOGLOBIN A1C
Est. average glucose Bld gHb Est-mCnc: 111 mg/dL
Hgb A1c MFr Bld: 5.5 % (ref 4.8–5.6)

## 2024-01-21 LAB — THYROID CASCADE PROFILE: TSH: 3.28 u[IU]/mL (ref 0.450–4.500)

## 2024-01-21 LAB — LIPID PANEL
Chol/HDL Ratio: 3.5 ratio (ref 0.0–4.4)
Cholesterol, Total: 177 mg/dL (ref 100–199)
HDL: 51 mg/dL (ref >39)
LDL Chol Calc (NIH): 109 mg/dL — ABNORMAL HIGH (ref 0–99)
Triglycerides: 94 mg/dL (ref 0–149)
VLDL Cholesterol Cal: 17 mg/dL (ref 5–40)

## 2024-01-21 LAB — INSULIN, RANDOM: INSULIN: 19.6 u[IU]/mL (ref 2.6–24.9)

## 2024-01-21 LAB — HEPATITIS C ANTIBODY: Hep C Virus Ab: NONREACTIVE

## 2024-01-25 ENCOUNTER — Ambulatory Visit: Payer: Self-pay | Admitting: Nurse Practitioner

## 2024-01-31 ENCOUNTER — Other Ambulatory Visit (INDEPENDENT_AMBULATORY_CARE_PROVIDER_SITE_OTHER)

## 2024-01-31 ENCOUNTER — Ambulatory Visit: Admitting: Orthopaedic Surgery

## 2024-01-31 ENCOUNTER — Telehealth: Payer: Self-pay

## 2024-01-31 DIAGNOSIS — M25562 Pain in left knee: Secondary | ICD-10-CM

## 2024-01-31 DIAGNOSIS — G8929 Other chronic pain: Secondary | ICD-10-CM

## 2024-01-31 DIAGNOSIS — M25561 Pain in right knee: Secondary | ICD-10-CM

## 2024-01-31 DIAGNOSIS — M1711 Unilateral primary osteoarthritis, right knee: Secondary | ICD-10-CM

## 2024-01-31 DIAGNOSIS — M1712 Unilateral primary osteoarthritis, left knee: Secondary | ICD-10-CM | POA: Diagnosis not present

## 2024-01-31 NOTE — Progress Notes (Signed)
 Office Visit Note   Patient: Kristina Leon           Date of Birth: 1985-11-03           MRN: [id number] Visit Date: 01/31/2024              Requested by: Oris Camie BRAVO, NP [address] [place],  [place] [address] PCP: Oris Camie BRAVO, NP   Assessment & Plan: Visit Diagnoses:  1. Primary osteoarthritis of left knee   2. Primary osteoarthritis of right knee   3. Chronic pain of both knees     Plan: History of Present Illness Kristina Leon is a 38 year old female who presents with bilateral knee pain, worse on the left.  Knee pain worsens with activities such as climbing stairs and bending, and improves with rest. Pain is localized around the kneecap and has been present for an unspecified duration. No swelling is observed in the knees.  She completed a three-month course of physical therapy without symptom relief. No MRI has been performed for further evaluation.  She maintains an active lifestyle, exercising six days a week at the gym, focusing on both upper and lower body workouts. Recent weight loss is noted.  Physical Exam MUSCULOSKELETAL: Significant patellofemoral crepitus bilaterally. Normal flexibility of the knee.  Assessment and Plan Chronic patellofemoral chondromalacia Chronic patellofemoral crepitus in both knees, left more symptomatic. Pain with activities, relieved by rest. X-rays normal, suggest early arthritis due to cartilage wear. Physical therapy ineffective. Possible muscle weakness from weight loss and exercise. - Obtain approval for gel injections, expected in three weeks. - Administer gel injections upon approval to alleviate knee pain. Cortisone injections not preferred due to her young age.  This patient is diagnosed with osteoarthritis of the knee(s).    Radiographs show evidence of joint space narrowing, osteophytes, subchondral sclerosis and/or subchondral cysts.  This patient has knee pain which interferes with functional and activities of  daily living.    This patient has experienced inadequate response, adverse effects and/or intolerance with conservative treatments such as acetaminophen , NSAIDS, topical creams, physical therapy or regular exercise, knee bracing and/or weight loss.   This patient has experienced inadequate response or has a contraindication to intra articular steroid injections for at least 3 months.   This patient is not scheduled to have a total knee replacement within 6 months of starting treatment with viscosupplementation.   Follow-Up Instructions: No follow-ups on file.   Orders:  Orders Placed This Encounter  Procedures   XR KNEE 3 VIEW RIGHT   XR KNEE 3 VIEW LEFT   No orders of the defined types were placed in this encounter.     Procedures: No procedures performed   Clinical Data: No additional findings.   Subjective: Chief Complaint  Patient presents with   Left Knee - Pain   Right Knee - Pain    HPI  Review of Systems  Constitutional: Negative.   HENT: Negative.    Eyes: Negative.   Respiratory: Negative.    Cardiovascular: Negative.   Endocrine: Negative.   Musculoskeletal: Negative.   Neurological: Negative.   Hematological: Negative.   Psychiatric/Behavioral: Negative.    All other systems reviewed and are negative.    Objective: Vital Signs: LMP 01/10/2024   Physical Exam Vitals and nursing note reviewed.  Constitutional:      Appearance: She is well-developed.  HENT:     Head: Atraumatic.     Nose: Nose normal.   Eyes:  Extraocular Movements: Extraocular movements intact.    Cardiovascular:     Pulses: Normal pulses.  Pulmonary:     Effort: Pulmonary effort is normal.  Abdominal:     Palpations: Abdomen is soft.   Musculoskeletal:     Cervical back: Neck supple.   Skin:    General: Skin is warm.     Capillary Refill: Capillary refill takes less than 2 seconds.   Neurological:     Mental Status: She is alert. Mental status is at  baseline.   Psychiatric:        Behavior: Behavior normal.        Thought Content: Thought content normal.        Judgment: Judgment normal.     Ortho Exam  Specialty Comments:  No specialty comments available.  Imaging: XR KNEE 3 VIEW LEFT Result Date: 01/31/2024 X-rays of the left knee show questionable cystic changes at the apex of the patella.  Well-preserved joint spaces.  XR KNEE 3 VIEW RIGHT Result Date: 01/31/2024 X-rays of the right knee show questionable cystic changes at the apex of the patella.  Well-preserved joint spaces.    PMFS History: Patient Active Problem List   Diagnosis Date Noted   Encounter for annual physical exam 01/20/2024   Chronic pain of left knee 01/20/2024   Abnormal thyroid  blood test 09/30/2021   Iron  deficiency 09/30/2021   Uterine leiomyoma 09/30/2021   Menorrhagia with regular cycle 09/30/2021   Iron  deficiency anemia due to chronic blood loss 05/02/2020   Class 1 obesity without serious comorbidity with body mass index (BMI) of 32.0 to 32.9 in adult 05/02/2020   Acute kidney injury (HCC) 10/26/2016   Past Medical History:  Diagnosis Date   Headache    no aura   Iron  deficiency anemia    Kidney stone    2009   Menorrhagia with irregular cycle 05/02/2020    Family History  Problem Relation Age of Onset   Anemia Mother    Diabetes Father    Anemia Sister    Breast cancer Maternal Aunt 64    Past Surgical History:  Procedure Laterality Date   BREAST SURGERY Right    cyst removed   CYSTECTOMY     BREAST   MYOMECTOMY N/A 01/06/2016   Procedure: MYOMECTOMY-abdominal;  Surgeon: Evalene SHAUNNA Organ, MD;  Location: [facility];  Service: Gynecology;  Laterality: N/A;   Social History   Occupational History   Not on file  Tobacco Use   Smoking status: Never   Smokeless tobacco: Never  Vaping Use   Vaping status: Never Used  Substance and Sexual Activity   Alcohol use: No    Alcohol/week: 0.0 standard drinks of alcohol   Drug  use: No   Sexual activity: Never    Birth control/protection: Patch    Comment: Virgin

## 2024-01-31 NOTE — Telephone Encounter (Signed)
 Please submit for bil knee gel inj's.

## 2024-02-01 NOTE — Telephone Encounter (Signed)
VOB submitted for Durolane, bilateral knee  

## 2024-02-09 ENCOUNTER — Telehealth: Admitting: Nurse Practitioner

## 2024-02-23 ENCOUNTER — Telehealth: Payer: Self-pay | Admitting: Nurse Practitioner

## 2024-02-23 ENCOUNTER — Telehealth: Payer: Self-pay

## 2024-02-23 ENCOUNTER — Other Ambulatory Visit: Payer: Self-pay

## 2024-02-23 DIAGNOSIS — D500 Iron deficiency anemia secondary to blood loss (chronic): Secondary | ICD-10-CM

## 2024-02-23 MED ORDER — POLYSACCHARIDE IRON COMPLEX 150 MG PO CAPS: 150.0000 mg | ORAL_CAPSULE | Freq: Every day | ORAL | 1 refills | Status: DC

## 2024-02-23 NOTE — Telephone Encounter (Signed)
 Called patient 3 times and the recording says call can not be completed at this time

## 2024-02-23 NOTE — Telephone Encounter (Signed)
 [facility] fax  Iron  Complex 150 mg #90  Last filled 03/01/2022

## 2024-02-24 ENCOUNTER — Encounter: Payer: Self-pay | Admitting: Nurse Practitioner

## 2024-02-24 ENCOUNTER — Telehealth (INDEPENDENT_AMBULATORY_CARE_PROVIDER_SITE_OTHER): Admitting: Nurse Practitioner

## 2024-02-24 ENCOUNTER — Other Ambulatory Visit: Payer: Self-pay

## 2024-02-24 VITALS — Wt 197.0 lb

## 2024-02-24 DIAGNOSIS — B3789 Other sites of candidiasis: Secondary | ICD-10-CM

## 2024-02-24 DIAGNOSIS — E66811 Obesity, class 1: Secondary | ICD-10-CM | POA: Diagnosis not present

## 2024-02-24 DIAGNOSIS — E669 Obesity, unspecified: Secondary | ICD-10-CM

## 2024-02-24 DIAGNOSIS — D500 Iron deficiency anemia secondary to blood loss (chronic): Secondary | ICD-10-CM | POA: Diagnosis not present

## 2024-02-24 DIAGNOSIS — Z6832 Body mass index (BMI) 32.0-32.9, adult: Secondary | ICD-10-CM | POA: Diagnosis not present

## 2024-02-24 DIAGNOSIS — B372 Candidiasis of skin and nail: Secondary | ICD-10-CM

## 2024-02-24 MED ORDER — PHENTERMINE HCL 37.5 MG PO TABS
37.5000 mg | ORAL_TABLET | Freq: Every day | ORAL | 2 refills | Status: DC
Start: 2024-02-24 — End: 2024-06-18

## 2024-02-24 MED ORDER — POLYSACCHARIDE IRON COMPLEX 150 MG PO CAPS: ORAL_CAPSULE | ORAL | 1 refills | Status: DC

## 2024-02-24 MED ORDER — NYSTATIN-TRIAMCINOLONE 100000-0.1 UNIT/GM-% EX OINT: 1.0000 | TOPICAL_OINTMENT | Freq: Two times a day (BID) | CUTANEOUS | 3 refills | Status: AC

## 2024-02-24 NOTE — Telephone Encounter (Signed)
 Medication refill request: nystatin -triamcinolone  ointment Last AEX:  03-23-23 Next AEX: 03-26-24 Last MMG (if hormonal medication request): n/a Refill authorized: please approve if appropriate

## 2024-02-24 NOTE — Progress Notes (Signed)
 Virtual Visit Encounter mychart visit.   I connected with  Kristina Leon on 02/24/24 at 11:00 AM EDT by secure video and audio telemedicine application. I verified that I am speaking with the correct person using two identifiers.   I introduced myself as a Publishing rights manager with the practice. The limitations of evaluation and management by telemedicine discussed with the patient and the availability of in person appointments. The patient expressed verbal understanding and consent to proceed.  Participating parties in this visit include: Myself and patient  The patient is: Patient Location: Home I am: Provider Location: Office/Clinic Subjective:    CC and HPI: Kristina Leon is a 38 y.o. year old female presenting for follow up of weight.  History of Present Illness Kristina Leon is a 38 year old female who presents for follow-up regarding weight management.  She has been actively trying to manage her weight through diet and exercise but feels frustrated by her lack of progress. She has previously not tried any medications for weight loss.  She is managing iron  deficiency with iron  polysaccharides, taking two tablets a day.  She has used nystatin -triamcinolone  ointment before and prefers it over oral medication for yeast infections and prevention. Generally sent by GYN, but she is awaiting a refill.   Past medical history, Surgical history, Family history not pertinant except as noted below, Social history, Allergies, and medications have been entered into the medical record, reviewed, and corrections made.   Review of Systems:  All review of systems negative except what is listed in the HPI  Objective:    Alert and oriented x 4 Speaking in clear sentences with no shortness of breath. No distress.  Impression and Recommendations:    Problem List Items Addressed This Visit     Iron  deficiency anemia due to chronic blood loss   She is on iron  polysaccharides (Niforex) for iron   deficiency anemia. Alternating the dosage may mitigate constipation, a common side effect. Gradual dosage increase may also reduce side effects. - Prescribe iron  polysaccharides with instructions to take two tablets a day, with the option to alternate the dosage to reduce constipation.      Relevant Medications   iron  polysaccharides (NIFEREX) 150 MG capsule   Class 1 obesity without serious comorbidity with body mass index (BMI) of 32.0 to 32.9 in adult   She is experiencing difficulty with weight loss despite adherence to a diet and exercise regimen for more than 6 months. Her labs show no evidence of metabolic condition contributing.  We discussed that phentermine, a stimulant, may aid in jumpstarting metabolism and reducing appetite. It is generally well-tolerated, with increased energy as a common side effect, potentially affecting sleep if taken late. Some may experience a temporary increase in heart rate initially. The medication can be taken intermittently and discontinued if adverse effects occur. Some have success with alternate-day dosing or starting with half a tablet to minimize side effects. She is interested in trial of this. - Prescribe phentermine with instructions to start with half a tablet in the morning and adjust as needed based on tolerance and effectiveness. - Continue current diet and exercise regimen.      Relevant Medications   phentermine (ADIPEX-P) 37.5 MG tablet   Candida rash of groin   She requires treatment for a yeast infection and prefers an ointment. An oral antifungal was discussed, but she opts for the ointment to prevent recurrence. - Prescribe nystatin  ointment to be applied to the affected area twice  daily      Other Visit Diagnoses       Obesity (BMI 30-39.9)    -  Primary   Relevant Medications   phentermine (ADIPEX-P) 37.5 MG tablet       orders and follow up as documented in EMR I discussed the assessment and treatment plan with the patient.  The patient was provided an opportunity to ask questions and all were answered. The patient agreed with the plan and demonstrated an understanding of the instructions.   The patient was advised to call back or seek an in-person evaluation if the symptoms worsen or if the condition fails to improve as anticipated.  Follow-Up: in 3 months  I provided 20 minutes of non-face-to-face interaction with this non face-to-face encounter including intake, same-day documentation, and chart review.   Camie CHARLENA Doing, NP , DNP, AGNP-c [facility]

## 2024-02-24 NOTE — Assessment & Plan Note (Signed)
 She is on iron  polysaccharides (Niforex) for iron  deficiency anemia. Alternating the dosage may mitigate constipation, a common side effect. Gradual dosage increase may also reduce side effects. - Prescribe iron  polysaccharides with instructions to take two tablets a day, with the option to alternate the dosage to reduce constipation.

## 2024-02-24 NOTE — Assessment & Plan Note (Signed)
 She is experiencing difficulty with weight loss despite adherence to a diet and exercise regimen for more than 6 months. Her labs show no evidence of metabolic condition contributing.  We discussed that phentermine, a stimulant, may aid in jumpstarting metabolism and reducing appetite. It is generally well-tolerated, with increased energy as a common side effect, potentially affecting sleep if taken late. Some may experience a temporary increase in heart rate initially. The medication can be taken intermittently and discontinued if adverse effects occur. Some have success with alternate-day dosing or starting with half a tablet to minimize side effects. She is interested in trial of this. - Prescribe phentermine with instructions to start with half a tablet in the morning and adjust as needed based on tolerance and effectiveness. - Continue current diet and exercise regimen.

## 2024-02-24 NOTE — Assessment & Plan Note (Signed)
 She requires treatment for a yeast infection and prefers an ointment. An oral antifungal was discussed, but she opts for the ointment to prevent recurrence. - Prescribe nystatin  ointment to be applied to the affected area twice daily

## 2024-02-27 ENCOUNTER — Other Ambulatory Visit (HOSPITAL_COMMUNITY): Payer: Self-pay

## 2024-02-27 ENCOUNTER — Telehealth: Payer: Self-pay

## 2024-02-27 NOTE — Telephone Encounter (Signed)
    Pharmacy Patient Advocate Encounter  Insurance verification completed.   The patient is insured through [facility]   Ran test claim for Iron  Polysaccharides. Currently a quantity of 60 is a 30 day supply and the co-pay is 7.80 .   This test claim was processed through [facility]- copay amounts may vary at other pharmacies due to pharmacy/plan contracts, or as the patient moves through the different stages of their insurance plan.

## 2024-02-27 NOTE — Telephone Encounter (Signed)
 Do you have a PA for this our will they just not fill it because they can get it over the counter.   Copied from CRM [id number]. Topic: Clinical - Prescription Issue >> Feb 27, 2024 11:02 AM Kristina Leon wrote: Reason for CRM: Patient called. States unable to fill iron  polysaccharides (NIFEREX) 150 MG capsule. Insurance will not cover because they say its over the counter. Patient says she has been using for years and its not over the counter. Would like someone to look into. She is not sure what changed. Thank You

## 2024-02-29 ENCOUNTER — Other Ambulatory Visit: Payer: Self-pay

## 2024-02-29 ENCOUNTER — Other Ambulatory Visit (HOSPITAL_COMMUNITY): Payer: Self-pay

## 2024-02-29 DIAGNOSIS — D500 Iron deficiency anemia secondary to blood loss (chronic): Secondary | ICD-10-CM

## 2024-02-29 MED ORDER — POLYSACCHARIDE IRON COMPLEX 150 MG PO CAPS
300.0000 mg | ORAL_CAPSULE | Freq: Every day | ORAL | 1 refills | Status: AC
  Filled 2024-02-29: qty 100, 50d supply, fill #0
  Filled 2024-06-26: qty 100, 50d supply, fill #1

## 2024-03-01 ENCOUNTER — Other Ambulatory Visit (HOSPITAL_COMMUNITY): Payer: Self-pay

## 2024-03-01 ENCOUNTER — Other Ambulatory Visit: Payer: Self-pay

## 2024-03-01 DIAGNOSIS — E538 Deficiency of other specified B group vitamins: Secondary | ICD-10-CM

## 2024-03-01 DIAGNOSIS — D500 Iron deficiency anemia secondary to blood loss (chronic): Secondary | ICD-10-CM

## 2024-03-01 NOTE — Progress Notes (Incomplete)
 HEMATOLOGY/ONCOLOGY CLINIC NOTE  Date of Service: 03/02/2024  Patient Care Team: Early, Camie BRAVO, NP as PCP - General (Nurse Practitioner)  CHIEF COMPLAINTS/PURPOSE OF CONSULTATION:  Evaluation and consultation for iron  deficiency anemia secondary to chronic blood loss from menorrhagia.  HISTORY OF PRESENTING ILLNESS:  Kristina Leon is a wonderful 38 y.o. female who has been referred to us  by Early, Camie BRAVO, NP for evaluation and consultation of iron  deficiency anemia secondary to chronic blood loss from menorrhagia.   She reports that soon after recent Feraheme  IV iron  she experienced vomiting. We discussed she may need IV iron  and we can consider other preparations such as IV Injectafer.   We discussed starting a vitamin B complex which she was agreeable to.  She notes that her significant blood loss is from menorrhagia.  She notes iron  deficiency anemia is common in her family.  She notes mild symptoms of PICA. She notes consuming more ice cream and smoothies which she notes is not as normal for her.  She reports some pedal edema when standing for long periods of time. She has not noticed any of this since 12/11/2021.  No history of blood transfusions.  No other abnormal bleeding. No other new or acute focal symptoms.  Labs done 12/09/2021 were reviewed in detail. CBC stable with Hgb at 7.6 and RBC count of 3.41. Iron  reduced at 13. Labs done 09/30/2021 were also reviewed. Iron  low at 16. Iron  sat ratio at 4% Ferritin at 21. 11/12/2021 Fecal occult blood negative  INTERVAL HISTORY:  Kristina Leon is a 38 y.o. female here for continued evaluation and management of iron  deficiency anemia secondary to chronic blood loss from menorrhagia.   Patient was last seen by me on 09/02/2023 and complained of heavy menstrual periods, an occasion of mild gum bleeding and 30-pound weight loss due to diet change and exercising.   -Discussed lab results on 03/02/2024 in detail with  patient. CBC showed WBC of ***K, hemoglobin of ***, and platelets of ***K. -   MEDICAL HISTORY:  Past Medical History:  Diagnosis Date   Headache    no aura   Iron  deficiency anemia    Kidney stone    2009   Menorrhagia with irregular cycle 05/02/2020    SURGICAL HISTORY: Past Surgical History:  Procedure Laterality Date   BREAST SURGERY Right    cyst removed   CYSTECTOMY     BREAST   MYOMECTOMY N/A 01/06/2016   Procedure: MYOMECTOMY-abdominal;  Surgeon: Evalene SHAUNNA Organ, MD;  Location: [facility];  Service: Gynecology;  Laterality: N/A;    SOCIAL HISTORY: Social History   Socioeconomic History   Marital status: Single    Spouse name: Not on file   Number of children: Not on file   Years of education: Not on file   Highest education level: Not on file  Occupational History   Not on file  Tobacco Use   Smoking status: Never   Smokeless tobacco: Never  Vaping Use   Vaping status: Never Used  Substance and Sexual Activity   Alcohol use: No    Alcohol/week: 0.0 standard drinks of alcohol   Drug use: No   Sexual activity: Never    Birth control/protection: Patch    Comment: Virgin  Other Topics Concern   Not on file  Social History Narrative   Lives in [place], lives with parents and several sisters and brothers. From [place] originally, then lived in [place]. Speaks English well.  Social Drivers of Corporate investment banker Strain: Not on file  Food Insecurity: Not on file  Transportation Needs: Not on file  Physical Activity: Not on file  Stress: Not on file  Social Connections: Not on file  Intimate Partner Violence: Not on file    FAMILY HISTORY: Family History  Problem Relation Age of Onset   Anemia Mother    Diabetes Father    Anemia Sister    Breast cancer Maternal Aunt 40    ALLERGIES:  is allergic to feraheme  [ferumoxytol ] and pork-derived products.  MEDICATIONS:  Current Outpatient Medications  Medication Sig Dispense Refill    etonogestrel -ethinyl estradiol  (NUVARING) 0.12-0.015 MG/24HR vaginal ring Place 1 each vaginally every 28 (twenty-eight) days. Insert vaginally and leave in place for 3 consecutive weeks, then remove for 1 week. 3 each 3   ibuprofen  (ADVIL ) 200 MG tablet Take 400 mg by mouth every 6 (six) hours as needed.     iron  polysaccharides (NIFEREX) 150 MG capsule Take 2 capsules (300 mg total) by mouth daily for anemia. 180 capsule 1   nystatin  (MYCOSTATIN /NYSTOP ) powder Apply 1 Application topically 3 (three) times daily. 30 g 1   nystatin -triamcinolone  ointment (MYCOLOG) Apply 1 Application topically 2 (two) times daily. 60 g 3   phentermine  (ADIPEX-P ) 37.5 MG tablet Take 1 tablet (37.5 mg total) by mouth daily before breakfast. 30 tablet 2   No current facility-administered medications for this visit.    REVIEW OF SYSTEMS:    10 Point review of Systems was done is negative except as noted above.  PHYSICAL EXAMINATION: ECOG PERFORMANCE STATUS: 1 - Symptomatic but completely ambulatory  . There were no vitals filed for this visit.   There were no vitals filed for this visit.   .There is no height or weight on file to calculate BMI.   GENERAL:alert, in no acute distress and comfortable SKIN: no acute rashes, no significant lesions EYES: conjunctiva are pink and non-injected, sclera anicteric OROPHARYNX: MMM, no exudates, no oropharyngeal erythema or ulceration NECK: supple, no JVD LYMPH:  no palpable lymphadenopathy in the cervical, axillary or inguinal regions LUNGS: clear to auscultation b/l with normal respiratory effort HEART: regular rate & rhythm ABDOMEN:  normoactive bowel sounds , non tender, not distended. Extremity: no pedal edema PSYCH: alert & oriented x 3 with fluent speech NEURO: no focal motor/sensory deficits   LABORATORY DATA:  I have reviewed the data as listed .    Latest Ref Rng & Units 01/20/2024    9:18 AM 09/02/2023    8:38 AM 05/02/2023    9:37 AM  CBC   WBC 3.4 - 10.8 x10E3/uL 5.3  6.5  5.9   Hemoglobin 11.1 - 15.9 g/dL 86.8  86.5  9.8   Hematocrit 34.0 - 46.6 % 40.5  40.3  34.3   Platelets 150 - 450 x10E3/uL 378  390  426    .    Latest Ref Rng & Units 01/20/2024    9:18 AM 09/02/2023    8:38 AM 05/02/2023    9:37 AM  CMP  Glucose 70 - 99 mg/dL 87  89  93   BUN 6 - 20 mg/dL 12  10  10    Creatinine 0.57 - 1.00 mg/dL 9.27  9.27  9.28   Sodium 134 - 144 mmol/L 140  137  138   Potassium 3.5 - 5.2 mmol/L 4.3  3.9  3.8   Chloride 96 - 106 mmol/L 104  104  107   CO2  20 - 29 mmol/L 20  26  25    Calcium 8.7 - 10.2 mg/dL 9.0  8.7  8.8   Total Protein 6.0 - 8.5 g/dL 7.1  7.4  7.5   Total Bilirubin 0.0 - 1.2 mg/dL 0.3  0.5  0.4   Alkaline Phos 44 - 121 IU/L 72  55  61   AST 0 - 40 IU/L 19  11  12    ALT 0 - 32 IU/L 13  7  8      . Lab Results  Component Value Date   IRON  36 01/20/2024   TIBC 315 01/20/2024   IRONPCTSAT 11 (L) 01/20/2024   (Iron  and TIBC)  Lab Results  Component Value Date   FERRITIN 31 01/20/2024   . Lab Results  Component Value Date   IRON  36 01/20/2024   TIBC 315 01/20/2024   IRONPCTSAT 11 (L) 01/20/2024   (Iron  and TIBC)  Lab Results  Component Value Date   FERRITIN 31 01/20/2024   B12 -- 213    RADIOGRAPHIC STUDIES: I have personally reviewed the radiological images as listed and agreed with the findings in the report. No results found.  ASSESSMENT & PLAN:   38 y.o. very pleasant female with  1. Iron  deficiency anemia secondary to chronic blood loss from menorrhagia Continues to have significant Iron  deficiency labs on labs today . Lab Results  Component Value Date   IRON  36 01/20/2024   TIBC 315 01/20/2024   IRONPCTSAT 11 (L) 01/20/2024   (Iron  and TIBC)  Lab Results  Component Value Date   FERRITIN 31 01/20/2024    2. B12 deficiency B12 -- 213  PLAN: -Discussed lab results from today, 05/02/2023, in detail with the patient. CBC shows patient is anemic with hemoglobin at 9.8  g/dL and hematocrit of 65.6%, and elevated platelets at 426 K.  CMP is wnl -. Iron  labs show continued Iron  deficiency with ferritin at 6 and iron  saturation at 4%. -Discussed the option of IV Monoferric since patient is anemic and severely iron  deficient and is having continued significant menstrual bleeding and is intending to conceive. -Schedule patient for IV Monoferric 1000 mg x 1 dose at [facility] in the next 1 to 2 weeks -would recommend B12 1000mcg SL daily OTC ongoing -B complex 1 cap MWF for 3 months -Return to clinic with Dr. Onesimo with labs in 4 months   FOLLOW-UP: ***  The total time spent in the appointment was *** minutes* .  All of the patient's questions were answered with apparent satisfaction. The patient knows to call the clinic with any problems, questions or concerns.   Emaline Onesimo MD MS AAHIVMS [facility] Hematology/Oncology Physician [facility]  .*Total Encounter Time as defined by the Centers for Medicare and Medicaid Services includes, in addition to the face-to-face time of a patient visit (documented in the note above) non-face-to-face time: obtaining and reviewing outside history, ordering and reviewing medications, tests or procedures, care coordination (communications with other health care professionals or caregivers) and documentation in the medical record.    I,Mitra Faeizi,acting as a Neurosurgeon for Emaline Onesimo, MD.,have documented all relevant documentation on the behalf of Emaline Onesimo, MD,as directed by  Emaline Onesimo, MD while in the presence of Emaline Onesimo, MD.  ***

## 2024-03-02 ENCOUNTER — Telehealth: Admitting: Nurse Practitioner

## 2024-03-02 ENCOUNTER — Inpatient Hospital Stay: Payer: 59 | Attending: Hematology

## 2024-03-02 ENCOUNTER — Inpatient Hospital Stay: Payer: 59 | Admitting: Hematology

## 2024-03-26 ENCOUNTER — Encounter: Payer: Self-pay | Admitting: Nurse Practitioner

## 2024-03-26 ENCOUNTER — Ambulatory Visit (INDEPENDENT_AMBULATORY_CARE_PROVIDER_SITE_OTHER): Payer: 59 | Admitting: Nurse Practitioner

## 2024-03-26 VITALS — BP 110/70 | HR 79 | Ht 64.57 in | Wt 195.4 lb

## 2024-03-26 DIAGNOSIS — Z1331 Encounter for screening for depression: Secondary | ICD-10-CM | POA: Diagnosis not present

## 2024-03-26 DIAGNOSIS — D500 Iron deficiency anemia secondary to blood loss (chronic): Secondary | ICD-10-CM | POA: Diagnosis not present

## 2024-03-26 DIAGNOSIS — B3731 Acute candidiasis of vulva and vagina: Secondary | ICD-10-CM

## 2024-03-26 DIAGNOSIS — N898 Other specified noninflammatory disorders of vagina: Secondary | ICD-10-CM

## 2024-03-26 DIAGNOSIS — Z01419 Encounter for gynecological examination (general) (routine) without abnormal findings: Secondary | ICD-10-CM | POA: Diagnosis not present

## 2024-03-26 DIAGNOSIS — N920 Excessive and frequent menstruation with regular cycle: Secondary | ICD-10-CM | POA: Diagnosis not present

## 2024-03-26 DIAGNOSIS — D259 Leiomyoma of uterus, unspecified: Secondary | ICD-10-CM

## 2024-03-26 LAB — WET PREP FOR TRICH, YEAST, CLUE

## 2024-03-26 MED ORDER — FLUCONAZOLE 150 MG PO TABS: 150.0000 mg | ORAL_TABLET | ORAL | 0 refills | Status: DC

## 2024-03-26 MED ORDER — ETONOGESTREL-ETHINYL ESTRADIOL 0.12-0.015 MG/24HR VA RING: 1.0000 | VAGINAL_RING | VAGINAL | 3 refills | Status: AC

## 2024-03-26 NOTE — Progress Notes (Signed)
 Kristina Leon 07-19-2037 [id number]   History:  38 y.o. G0 presents for annual exam. H/O heavy menses s/t uterine fibroids. Has received iron  transfusions in the past. Taking oral supplement daily. Bleeding managed well with Nuvaring. S/P Myomectomy 01/2016. US  12/2020 showed 6 fibroids, largest measuring 5.2 cm. Interested in something to help managed fibroids. Offered Myfembree  in the past but did not start. Feels more pressure in her abdomen. Hoping to conceive in the future. Also complains of vaginal discharge and odor for months. Denies itching. Never been sexually active. Normal pap history.  Gynecologic History Patient's last menstrual period was 02/19/2024 (within days). Period Cycle (Days): 28 Period Duration (Days): 5-7 Period Pattern: Regular Menstrual Flow: Moderate Menstrual Control: Maxi pad Dysmenorrhea: (!) Severe (starts severe and then goes down to mild) Dysmenorrhea Symptoms: Cramping, Headache Contraception/Family planning: abstinence and NuvaRing vaginal inserts Sexually active: No  Health Maintenance Last Pap: 03/22/2037. Results were: Normal neg HPV Last mammogram: Not indicated Last colonoscopy: Not indicated Last Dexa: Not indicated     03/26/2024    8:17 AM  Depression screen PHQ 38/9  Decreased Interest 0  Down, Depressed, Hopeless 0  PHQ - 2 Score 0     Past medical history, past surgical history, family history and social history were all reviewed and documented in the EPIC chart. Engaged, arranged marriage, he lives in [place] and war has caused delay. 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   ROS:  A ROS was performed and pertinent positives and negatives are included.  Exam:  Vitals:   03/26/24 0813  BP: 110/70  Pulse: 79  SpO2: 99%  Weight: 195 lb 6.4 oz (88.6 kg)  Height: 5' 4.57 (1.64 m)      Body mass index is 32.95 kg/m.  General appearance:  Normal Thyroid :  Symmetrical, normal in size, without palpable masses or  nodularity. Respiratory  Auscultation:  Clear without wheezing or rhonchi Cardiovascular  Auscultation:  Regular rate, without rubs, murmurs or gallops  Edema/varicosities:  Not grossly evident Abdominal  Soft,nontender, without masses, guarding or rebound.  Liver/spleen:  No organomegaly noted  Hernia:  None appreciated  Skin  Inspection:  Grossly normal Breasts: Examined lying and sitting.   Right: Without masses, retractions, nipple discharge or axillary adenopathy.   Left: Without masses, retractions, nipple discharge or axillary adenopathy. Pelvic: External genitalia:  no lesions              Urethra:  normal appearing urethra with no masses, tenderness or lesions              Bartholins and Skenes: normal                 Vagina: normal appearing vagina with normal color and discharge, no lesions              Cervix: no lesions Bimanual Exam:  Uterus:  14 week fibroid uterus, no tenderness              Adnexa: no mass, fullness, tenderness              Rectovaginal: Deferred              Anus:  normal, no lesions  Geni Pica, CMA present as chaperone.   Wet prep + yeast (budding & hyphae noted)  Assessment/Plan:  38 y.o. G0 for annual exam.   Well female exam with routine gynecological exam - Education provided on SBEs, importance of preventative screenings, current guidelines, high calcium diet,  regular exercise, and multivitamin daily. Labs with PCP.   Iron  deficiency anemia due to chronic blood loss - H/O iron  transfusions, taking daily oral supplement. S/t menses. Stable with supplement and Nuvaring.   Menorrhagia with regular cycle - Plan: etonogestrel -ethinyl estradiol  (NUVARING) 0.12-0.015 MG/24HR vaginal ring, US  PELVIS TRANSVAGINAL NON-OB (TV ONLY).   Uterine leiomyoma, unspecified location - Plan: etonogestrel -ethinyl estradiol  (NUVARING) 0.12-0.015 MG/24HR vaginal ring every 28 days, US  PELVIS TRANSVAGINAL NON-OB (TV ONLY). Discussed medical and surgical options.  Considering Myfembree . Will get updated ultrasound.   Vaginal odor - Plan: WET PREP FOR TRICH, YEAST, CLUE.   Vaginal candidiasis - Plan: fluconazole  (DIFLUCAN ) 150 MG tablet every 3 days x 2 doses.   Screening for cervical cancer - Normal pap history. Will repeat at 5-year interval per guidelines.   Return in about 1 year (around 03/26/2025) for Annual.     Kristina DELENA Shutter DNP, 8:42 AM 03/26/2024

## 2024-03-27 ENCOUNTER — Other Ambulatory Visit: Payer: Self-pay | Admitting: Nurse Practitioner

## 2024-03-27 DIAGNOSIS — N920 Excessive and frequent menstruation with regular cycle: Secondary | ICD-10-CM

## 2024-03-27 DIAGNOSIS — D259 Leiomyoma of uterus, unspecified: Secondary | ICD-10-CM

## 2024-03-27 NOTE — Telephone Encounter (Deleted)
 Med refill request:   Etonogestrel -ethinyl estradiol  (NUVARING) 0.12-0.015 MG/24HR vaginal ring  Last AEX:   Next AEX: Last MMG (if hormonal med) Refill authorized: Please Advise?

## 2024-03-27 NOTE — Telephone Encounter (Signed)
 Nuvaring refill came in. It was sent yesterday for 3 rings with 3 refills. This rx denied.

## 2024-03-29 ENCOUNTER — Telehealth: Payer: Self-pay | Admitting: *Deleted

## 2024-03-29 NOTE — Telephone Encounter (Signed)
 PUS ordered 04/12/24 for fibroids.   Routing to [employer], Early, Kate SAILOR, RN Yes, but couldn't pay anything right now, said would call back to reschedule   ----- Message ----- From: Brutus Kate SAILOR, RN Sent: 03/26/2024   3:25 PM EDT To: Renda Cahill  Was she offered payment plan?  [facility] ----- Message ----- From: Cahill Renda Sent: 03/26/2024   3:16 PM EDT To: [facility] Triage  Called pt to inform PR for 9/11 appt; stated could not afford to pay it and to cx for the moment.

## 2024-04-06 ENCOUNTER — Inpatient Hospital Stay: Attending: Hematology

## 2024-04-06 ENCOUNTER — Inpatient Hospital Stay (HOSPITAL_BASED_OUTPATIENT_CLINIC_OR_DEPARTMENT_OTHER): Admitting: Hematology

## 2024-04-06 DIAGNOSIS — Z803 Family history of malignant neoplasm of breast: Secondary | ICD-10-CM | POA: Insufficient documentation

## 2024-04-06 DIAGNOSIS — D500 Iron deficiency anemia secondary to blood loss (chronic): Secondary | ICD-10-CM | POA: Diagnosis not present

## 2024-04-06 DIAGNOSIS — Z793 Long term (current) use of hormonal contraceptives: Secondary | ICD-10-CM | POA: Diagnosis not present

## 2024-04-06 DIAGNOSIS — N920 Excessive and frequent menstruation with regular cycle: Secondary | ICD-10-CM | POA: Insufficient documentation

## 2024-04-06 DIAGNOSIS — E538 Deficiency of other specified B group vitamins: Secondary | ICD-10-CM | POA: Diagnosis present

## 2024-04-06 DIAGNOSIS — Z79899 Other long term (current) drug therapy: Secondary | ICD-10-CM | POA: Diagnosis not present

## 2024-04-06 LAB — CBC WITH DIFFERENTIAL (CANCER CENTER ONLY)
Abs Immature Granulocytes: 0.02 K/uL (ref 0.00–0.07)
Basophils Absolute: 0.1 K/uL (ref 0.0–0.1)
Basophils Relative: 1 %
Eosinophils Absolute: 0.1 K/uL (ref 0.0–0.5)
Eosinophils Relative: 2 %
HCT: 41.8 % (ref 36.0–46.0)
Hemoglobin: 14 g/dL (ref 12.0–15.0)
Immature Granulocytes: 0 %
Lymphocytes Relative: 29 %
Lymphs Abs: 1.5 K/uL (ref 0.7–4.0)
MCH: 28.2 pg (ref 26.0–34.0)
MCHC: 33.5 g/dL (ref 30.0–36.0)
MCV: 84.3 fL (ref 80.0–100.0)
Monocytes Absolute: 0.4 K/uL (ref 0.1–1.0)
Monocytes Relative: 7 %
Neutro Abs: 3.2 K/uL (ref 1.7–7.7)
Neutrophils Relative %: 61 %
Platelet Count: 348 K/uL (ref 150–400)
RBC: 4.96 MIL/uL (ref 3.87–5.11)
RDW: 13.3 % (ref 11.5–15.5)
WBC Count: 5.3 K/uL (ref 4.0–10.5)
nRBC: 0 % (ref 0.0–0.2)

## 2024-04-06 LAB — CMP (CANCER CENTER ONLY)
ALT: 11 U/L (ref 0–44)
AST: 17 U/L (ref 15–41)
Albumin: 4.1 g/dL (ref 3.5–5.0)
Alkaline Phosphatase: 61 U/L (ref 38–126)
Anion gap: 5 (ref 5–15)
BUN: 12 mg/dL (ref 6–20)
CO2: 27 mmol/L (ref 22–32)
Calcium: 9.1 mg/dL (ref 8.9–10.3)
Chloride: 106 mmol/L (ref 98–111)
Creatinine: 0.66 mg/dL (ref 0.44–1.00)
GFR, Estimated: >60 mL/min (ref >60)
Glucose, Bld: 84 mg/dL (ref 70–99)
Potassium: 4.1 mmol/L (ref 3.5–5.1)
Sodium: 138 mmol/L (ref 135–145)
Total Bilirubin: 0.4 mg/dL (ref 0.0–1.2)
Total Protein: 7.9 g/dL (ref 6.5–8.1)

## 2024-04-06 LAB — IRON AND IRON BINDING CAPACITY (CC-WL,HP ONLY)
Iron: 47 ug/dL (ref 28–170)
Saturation Ratios: 12 % (ref 10.4–31.8)
TIBC: 378 ug/dL (ref 250–450)
UIBC: 331 ug/dL (ref 148–442)

## 2024-04-06 LAB — VITAMIN B12: Vitamin B-12: 1446 pg/mL — ABNORMAL HIGH (ref 180–914)

## 2024-04-06 LAB — FERRITIN: Ferritin: 35 ng/mL (ref 11–307)

## 2024-04-06 NOTE — Telephone Encounter (Signed)
 Kristina Leon -patient is still scheduled for PUS 04/12/24. Does she plan to keep this appt?

## 2024-04-10 ENCOUNTER — Other Ambulatory Visit: Payer: Self-pay

## 2024-04-10 DIAGNOSIS — M1711 Unilateral primary osteoarthritis, right knee: Secondary | ICD-10-CM

## 2024-04-10 DIAGNOSIS — M1712 Unilateral primary osteoarthritis, left knee: Secondary | ICD-10-CM

## 2024-04-12 ENCOUNTER — Ambulatory Visit (INDEPENDENT_AMBULATORY_CARE_PROVIDER_SITE_OTHER): Admitting: Nurse Practitioner

## 2024-04-12 ENCOUNTER — Encounter: Payer: Self-pay | Admitting: Nurse Practitioner

## 2024-04-12 ENCOUNTER — Ambulatory Visit (INDEPENDENT_AMBULATORY_CARE_PROVIDER_SITE_OTHER)

## 2024-04-12 VITALS — BP 110/74 | HR 86

## 2024-04-12 DIAGNOSIS — N920 Excessive and frequent menstruation with regular cycle: Secondary | ICD-10-CM | POA: Diagnosis not present

## 2024-04-12 DIAGNOSIS — D259 Leiomyoma of uterus, unspecified: Secondary | ICD-10-CM

## 2024-04-12 NOTE — Progress Notes (Signed)
   Acute Office Visit  Subjective:    Patient ID: Kristina Leon, female    DOB: [birth year], 38 y.o.   MRN: [id number]   HPI 38 y.o. presents today for ultrasound. H/O heavy menses s/t uterine fibroids. Has received iron  transfusions in the past. Taking oral supplement daily. Bleeding managed well with Nuvaring. Normal iron  levels 9/5. S/P Myomectomy 01/2016. US  12/2020 showed 6 fibroids, largest measuring 5.2 cm. Interested in something to help manage fibroids. Offered Myfembree  in the past but did not start. Feels more pressure in her abdomen. Hoping for pregnancy in the future.   Patient's last menstrual period was 04/12/2024 (within days).    Review of Systems  Constitutional: Negative.   Genitourinary:  Positive for menstrual problem.       Objective:    Physical Exam Constitutional:      Appearance: Normal appearance.   GU: Not indicated  BP 110/74   Pulse 86   LMP 04/12/2024 (Within Days)   SpO2 97%  Wt Readings from Last 3 Encounters:  03/26/24 195 lb 6.4 oz (88.6 kg)  02/24/24 197 lb (89.4 kg)  01/20/24 197 lb 9.6 oz (89.6 kg)        Assessment & Plan:   Problem List Items Addressed This Visit       Genitourinary   Uterine leiomyoma     Other   Menorrhagia with regular cycle - Primary   Transabdominal ultrasound (comparison is made to 2022 ultrasound): Anteverted uterus, enlarged with multiple intramural/subserosal/submucosal/pedunculated fibroids.  Total of 15 (see report for details).  Largest measuring 4 cm.  Endometrium 9 mm with limited views due to fibroids.  Submucosal fibroid noted 14 x 11 mm in fundal endometrium.  Both ovaries within normal limits.  No adnexal masses, no free fluid.  Plan: Ultrasound reviewed with patient and Dr. Glennon. Patient is interested in surgical options and will schedule consult with plans to discuss myomectomy and to flush tubes. Continue Nuvaring.    Kristina DELENA Shutter DNP, 11:47 AM 04/12/2024

## 2024-04-15 NOTE — Progress Notes (Signed)
 HEMATOLOGY/ONCOLOGY CLINIC NOTE  Date of Service: .04/06/2024   Patient Care Team: Early, Camie BRAVO, NP as PCP - General (Nurse Practitioner)  CHIEF COMPLAINTS/PURPOSE OF CONSULTATION:  Follow-up for continued evaluation and management of iron  deficiency anemia secondary to menorrhagia  HISTORY OF PRESENTING ILLNESS:  Kristina Leon is a wonderful 38 y.o. female who has been referred to us  by Early, Camie BRAVO, NP for evaluation and consultation of iron  deficiency anemia secondary to chronic blood loss from menorrhagia.   She reports that soon after recent Feraheme  IV iron  she experienced vomiting. We discussed she may need IV iron  and we can consider other preparations such as IV Injectafer.   We discussed starting a vitamin B complex which she was agreeable to.  She notes that her significant blood loss is from menorrhagia.  She notes iron  deficiency anemia is common in her family.  She notes mild symptoms of PICA. She notes consuming more ice cream and smoothies which she notes is not as normal for her.  She reports some pedal edema when standing for long periods of time. She has not noticed any of this since 12/11/2021.  No history of blood transfusions.  No other abnormal bleeding. No other new or acute focal symptoms.  Labs done 12/09/2021 were reviewed in detail. CBC stable with Hgb at 7.6 and RBC count of 3.41. Iron  reduced at 13. Labs done 09/30/2021 were also reviewed. Iron  low at 16. Iron  sat ratio at 4% Ferritin at 21. 11/12/2021 Fecal occult blood negative  INTERVAL HISTORY:  Kristina Leon is a 38 y.o. female who is here for continued valuation and management of iron  deficiency anemia which has been due to her menorrhagia. She notes that she has been using the NuvaRing and her periods are better controlled.  Continues to take oral iron  polysaccharide as well as her B12 daily and B complex 3 times a week. Tolerated her last IV iron  without any issues. Labs done  today were reviewed in detail with her.   MEDICAL HISTORY:  Past Medical History:  Diagnosis Date   Headache    no aura   Iron  deficiency anemia    Kidney stone    2009   Menorrhagia with irregular cycle 05/02/2020    SURGICAL HISTORY: Past Surgical History:  Procedure Laterality Date   BREAST SURGERY Right    cyst removed   CYSTECTOMY     BREAST   MYOMECTOMY N/A 01/06/2016   Procedure: MYOMECTOMY-abdominal;  Surgeon: Evalene SHAUNNA Organ, MD;  Location: [facility];  Service: Gynecology;  Laterality: N/A;    SOCIAL HISTORY: Social History   Socioeconomic History   Marital status: Single    Spouse name: Not on file   Number of children: Not on file   Years of education: Not on file   Highest education level: Not on file  Occupational History   Not on file  Tobacco Use   Smoking status: Never   Smokeless tobacco: Never  Vaping Use   Vaping status: Never Used  Substance and Sexual Activity   Alcohol use: No    Alcohol/week: 0.0 standard drinks of alcohol   Drug use: No   Sexual activity: Never    Birth control/protection: Inserts    Comment: Virgin,vaginal ring  Other Topics Concern   Not on file  Social History Narrative   Lives in [place], lives with parents and several sisters and brothers. From [place] originally, then lived in [place]. Speaks English well.    Social  Drivers of Corporate investment banker Strain: Not on file  Food Insecurity: Not on file  Transportation Needs: Not on file  Physical Activity: Not on file  Stress: Not on file  Social Connections: Not on file  Intimate Partner Violence: Not on file    FAMILY HISTORY: Family History  Problem Relation Age of Onset   Anemia Mother    Diabetes Father    Anemia Sister    Breast cancer Maternal Aunt 92    ALLERGIES:  is allergic to feraheme  [ferumoxytol ] and pork-derived products.  MEDICATIONS:  Current Outpatient Medications  Medication Sig Dispense Refill   etonogestrel -ethinyl estradiol   (NUVARING) 0.12-0.015 MG/24HR vaginal ring Place 1 each vaginally every 28 (twenty-eight) days. Insert vaginally and leave in place for 3 consecutive weeks, then remove for 1 week. 3 each 3   Ferrous Fum-Iron  Polysacch (TANDEM) 53-53 MG CAPS Take 1 capsule by mouth daily with breakfast.     ibuprofen  (ADVIL ) 200 MG tablet Take 400 mg by mouth every 6 (six) hours as needed.     iron  polysaccharides (NIFEREX) 150 MG capsule Take 2 capsules (300 mg total) by mouth daily for anemia. 180 capsule 1   nystatin  (MYCOSTATIN /NYSTOP ) powder Apply 1 Application topically 3 (three) times daily. 30 g 1   nystatin -triamcinolone  ointment (MYCOLOG) Apply 1 Application topically 2 (two) times daily. 60 g 3   phentermine  (ADIPEX-P ) 37.5 MG tablet Take 1 tablet (37.5 mg total) by mouth daily before breakfast. 30 tablet 2   No current facility-administered medications for this visit.    REVIEW OF SYSTEMS:    .10 Point review of Systems was done is negative except as noted above.  PHYSICAL EXAMINATION: ECOG PERFORMANCE STATUS: 1 - Symptomatic but completely ambulatory VSS  GENERAL:alert, in no acute distress and comfortable SKIN: no acute rashes, no significant lesions EYES: conjunctiva are pink and non-injected, sclera anicteric OROPHARYNX: MMM, no exudates, no oropharyngeal erythema or ulceration NECK: supple, no JVD LYMPH:  no palpable lymphadenopathy in the cervical, axillary or inguinal regions LUNGS: clear to auscultation b/l with normal respiratory effort HEART: regular rate & rhythm ABDOMEN:  normoactive bowel sounds , non tender, not distended. Extremity: no pedal edema PSYCH: alert & oriented x 3 with fluent speech NEURO: no focal motor/sensory deficits   LABORATORY DATA:  I have reviewed the data as listed .    Latest Ref Rng & Units 04/06/2024    1:27 PM 01/20/2024    9:18 AM 09/02/2023    8:38 AM  CBC  WBC 4.0 - 10.5 K/uL 5.3  5.3  6.5   Hemoglobin 12.0 - 15.0 g/dL 85.9  86.8  86.5    Hematocrit 36.0 - 46.0 % 41.8  40.5  40.3   Platelets 150 - 400 K/uL 348  378  390    .    Latest Ref Rng & Units 04/06/2024    1:27 PM 01/20/2024    9:18 AM 09/02/2023    8:38 AM  CMP  Glucose 70 - 99 mg/dL 84  87  89   BUN 6 - 20 mg/dL 12  12  10    Creatinine 0.44 - 1.00 mg/dL 9.33  9.27  9.27   Sodium 135 - 145 mmol/L 138  140  137   Potassium 3.5 - 5.1 mmol/L 4.1  4.3  3.9   Chloride 98 - 111 mmol/L 106  104  104   CO2 22 - 32 mmol/L 27  20  26    Calcium 8.9 - 10.3  mg/dL 9.1  9.0  8.7   Total Protein 6.5 - 8.1 g/dL 7.9  7.1  7.4   Total Bilirubin 0.0 - 1.2 mg/dL 0.4  0.3  0.5   Alkaline Phos 38 - 126 U/L 61  72  55   AST 15 - 41 U/L 17  19  11    ALT 0 - 44 U/L 11  13  7     . Lab Results  Component Value Date   IRON  47 04/06/2024   TIBC 378 04/06/2024   IRONPCTSAT 12 04/06/2024   (Iron  and TIBC)  Lab Results  Component Value Date   FERRITIN 35 04/06/2024    RADIOGRAPHIC STUDIES: I have personally reviewed the radiological images as listed and agreed with the findings in the report.   ASSESSMENT & PLAN:   38 y.o. very pleasant female with  1. Iron  deficiency anemia secondary to chronic blood loss from menorrhagia  Lab Results  Component Value Date   IRON  47 04/06/2024   TIBC 378 04/06/2024   IRONPCTSAT 12 04/06/2024   (Iron  and TIBC)  Lab Results  Component Value Date   FERRITIN 35 04/06/2024    2. B12 deficiency B12 -- 213--- improved to 880-->1446  PLAN: Discussed lab results from today 04/06/2024 CBC is within normal limits with improvement of hemoglobin to 14 with normal WBC count and platelets normal MCV and RDW CMP stable Ferritin of 35 with an iron  saturation of 12% B12 levels within normal limits Patient tolerating her oral iron  well at this time and it is holding her blood counts and iron  levels.  Her menorrhagia is somewhat better with the NuvaRing but she is still having a lot of cramping. No indication for IV iron  at this time. Continue  oral iron  polysaccharide 150 mg p.o. daily to try to target a ferritin of 100 to ensure adequate iron  stores. Continue vitamin B-12 1000 mcg sublingually 3 times a week and B complex 1 capsule p.o. 3 days a week. -Continue follow-up with OB/GYN for management of her menorrhagia related to significant uterine fibroids.  Patient notes she has an ultrasound of the pelvis coming up on 04/12/2024. -At this time she would like to follow-up with her PCP/OB/GYN for monitoring of her iron  levels and adjusting her oral iron  replacement.  Will need repeat labs in 3 to 4 months. We shall see her back as needed.  FOLLOW-UP: Return to clinic with PCP  The total time spent in the appointment was 20 minutes*.  All of the patient's questions were answered with apparent satisfaction. The patient knows to call the clinic with any problems, questions or concerns.   Emaline Saran MD MS AAHIVMS [facility] Hematology/Oncology Physician [facility]  .*Total Encounter Time as defined by the Centers for Medicare and Medicaid Services includes, in addition to the face-to-face time of a patient visit (documented in the note above) non-face-to-face time: obtaining and reviewing outside history, ordering and reviewing medications, tests or procedures, care coordination (communications with other health care professionals or caregivers) and documentation in the medical record.

## 2024-04-24 ENCOUNTER — Ambulatory Visit: Admitting: Orthopaedic Surgery

## 2024-04-24 DIAGNOSIS — M1711 Unilateral primary osteoarthritis, right knee: Secondary | ICD-10-CM | POA: Diagnosis not present

## 2024-04-24 DIAGNOSIS — M1712 Unilateral primary osteoarthritis, left knee: Secondary | ICD-10-CM

## 2024-04-24 MED ORDER — SODIUM HYALURONATE 60 MG/3ML IX PRSY
60.0000 mg | PREFILLED_SYRINGE | INTRA_ARTICULAR | Status: AC | PRN
  Administered 2024-04-24: 60 mg via INTRA_ARTICULAR

## 2024-04-24 NOTE — Progress Notes (Signed)
   Procedure Note  Patient: Kristina Leon             Date of Birth: 1985-12-23           MRN: [id number]             Visit Date: 04/24/2024  Procedures: Visit Diagnoses:  1. Primary osteoarthritis of left knee   2. Primary osteoarthritis of right knee     Large Joint Inj: R knee on 04/24/2024 7:20 AM Indications: pain Details: 22 G needle  Arthrogram: No  Medications: 60 mg Sodium Hyaluronate 60 MG/3ML Outcome: tolerated well, no immediate complications Patient was prepped and draped in the usual sterile fashion.

## 2024-05-17 ENCOUNTER — Ambulatory Visit: Admitting: Obstetrics and Gynecology

## 2024-05-17 ENCOUNTER — Encounter: Payer: Self-pay | Admitting: Obstetrics and Gynecology

## 2024-05-17 VITALS — BP 112/72 | HR 88 | Ht 64.57 in | Wt 192.2 lb

## 2024-05-17 DIAGNOSIS — D500 Iron deficiency anemia secondary to blood loss (chronic): Secondary | ICD-10-CM | POA: Diagnosis not present

## 2024-05-17 DIAGNOSIS — N920 Excessive and frequent menstruation with regular cycle: Secondary | ICD-10-CM

## 2024-05-17 DIAGNOSIS — D259 Leiomyoma of uterus, unspecified: Secondary | ICD-10-CM | POA: Diagnosis not present

## 2024-05-17 NOTE — Progress Notes (Signed)
 Acute Office Visit  Subjective:    Patient ID: Kristina Leon, female    DOB: 1985-11-24, 38 y.o.   MRN: [id number]   HPI Patient presents for surgical consult for robotic myomectomy, myosure D&C, ECC, chromopertubation from [name]  38 y.o. presents today for ultrasound. H/O heavy menses s/t uterine fibroids. Has received iron  transfusions in the past. Taking oral supplement daily. Bleeding managed well with Nuvaring. Normal iron  levels 9/5. S/P Myomectomy 01/2016. US  12/2020 showed 6 fibroids, largest measuring 5.2 cm. Interested in something to help manage fibroids. Offered Myfembree  in the past but did not start. Feels more pressure in her abdomen. Hoping for pregnancy in the future.  She would like to have robotic vs. Open myomectomy.  Patient's last menstrual period was 05/11/2024. Period Cycle (Days): 28 Period Duration (Days): 7 Period Pattern: Regular Menstrual Flow: Moderate Menstrual Control: Maxi pad Dysmenorrhea: (!) Severe Dysmenorrhea Symptoms: Headache  Review of Systems  Constitutional: Negative.   Genitourinary:  Positive for menstrual problem.       Objective:    Physical Exam Constitutional:      Appearance: Normal appearance.   GU: Not indicated  BP 112/72   Pulse 88   Ht 5' 4.57 (1.64 m)   Wt 192 lb 3.2 oz (87.2 kg)   LMP 05/11/2024   SpO2 99%   BMI 32.41 kg/m  Wt Readings from Last 3 Encounters:  05/17/24 192 lb 3.2 oz (87.2 kg)  03/26/24 195 lb 6.4 oz (88.6 kg)  02/24/24 197 lb (89.4 kg)        Assessment & Plan:   Problem List Items Addressed This Visit       Genitourinary   Uterine leiomyoma   Relevant Orders   Ambulatory Referral For Surgery Scheduling     Other   Iron  deficiency anemia due to chronic blood loss   Relevant Orders   Ambulatory Referral For Surgery Scheduling   Menorrhagia with regular cycle - Primary   Relevant Orders   Ambulatory Referral For Surgery Scheduling    Transabdominal ultrasound  (comparison is made to 2022 ultrasound): Anteverted uterus, enlarged with multiple intramural/subserosal/submucosal/pedunculated fibroids.  Total of 15 (see report for details).  Largest measuring 4 cm.  Endometrium 9 mm with limited views due to fibroids.  Submucosal fibroid noted 14 x 11 mm in fundal endometrium.  Both ovaries within normal limits.  No adnexal masses, no free fluid.  Plan:  Symptomatic fibroids History of myomectomy Menorrhagia Anemia Submucosal fibroid Desires future fertility  Discussed the robotic myomectomy in detail.  Discussed the risk of bleeding, risk of blood products, infection, risk 50% in 5 years of fibroids recur, discussed the risk of hysterectomy in the event of bleeding.  Discussed that it may not be safe to remove all the visible fibroids and will be assessed during the case and how much blood loss is noted after each removal.  Discussed the olympus bag and morcellator and indications and process to use this.  Discussed possible laparotomy.  She may still have irregular bleeding after, but with the myomectomy should get some time with significantly improved cycles after.  Discussed running the myosure before the myomectomy to assess if there are any submucosal fibroids and identify the location of them.  Smaller ie less than 4cm can be removed with the myosure. Patient would like to proceed with the procedure. Case request was sent. Discussed that she will need a cesarean delivery in the future and still offered an open myomectomy and  she would like to have the robotic myomectomy.  40 minutes spent on reviewing records, imaging,  and one on one patient time and counseling patient and documentation Dr. Glennon Almarie MARLA Glennon DNP, 3:59 PM 05/22/2024

## 2024-06-04 ENCOUNTER — Encounter: Payer: Self-pay | Admitting: Radiology

## 2024-06-18 ENCOUNTER — Other Ambulatory Visit: Payer: Self-pay | Admitting: Nurse Practitioner

## 2024-06-18 DIAGNOSIS — E669 Obesity, unspecified: Secondary | ICD-10-CM

## 2024-06-18 MED ORDER — PHENTERMINE HCL 37.5 MG PO TABS: 37.5000 mg | ORAL_TABLET | Freq: Every day | ORAL | 2 refills | Status: AC

## 2024-06-18 NOTE — Telephone Encounter (Signed)
 Copied from CRM #[id number]. Topic: Clinical - Medication Refill >> Jun 18, 2024 11:00 AM China J wrote: Medication: phentermine  (ADIPEX-P ) 37.5 MG tablet  Has the patient contacted their pharmacy? Yes (Agent: If no, request that the patient contact the pharmacy for the refill. If patient does not wish to contact the pharmacy document the reason why and proceed with request.) (Agent: If yes, when and what did the pharmacy advise?) Pharmacy cannot refill medication since it is a controlled substance.  This is the patient's preferred pharmacy:  [id number] - [place], [place] - [address] AT [facility] OF [address] [place] [address] Phone: [phone] Fax: [phone]  Is this the correct pharmacy for this prescription? Yes If no, delete pharmacy and type the correct one.   Has the prescription been filled recently? No  Is the patient out of the medication? No  Has the patient been seen for an appointment in the last year OR does the patient have an upcoming appointment? Yes  Can we respond through MyChart? Yes  Agent: Please be advised that Rx refills may take up to 3 business days. We ask that you follow-up with your pharmacy.

## 2024-06-21 ENCOUNTER — Ambulatory Visit: Admitting: Nurse Practitioner

## 2024-06-21 ENCOUNTER — Encounter: Payer: Self-pay | Admitting: Nurse Practitioner

## 2024-06-21 VITALS — BP 128/82 | HR 92 | Wt 193.8 lb

## 2024-06-21 DIAGNOSIS — E66811 Obesity, class 1: Secondary | ICD-10-CM

## 2024-06-21 DIAGNOSIS — D259 Leiomyoma of uterus, unspecified: Secondary | ICD-10-CM | POA: Diagnosis not present

## 2024-06-21 DIAGNOSIS — Z6832 Body mass index (BMI) 32.0-32.9, adult: Secondary | ICD-10-CM

## 2024-06-21 DIAGNOSIS — Z23 Encounter for immunization: Secondary | ICD-10-CM | POA: Diagnosis not present

## 2024-06-21 DIAGNOSIS — D500 Iron deficiency anemia secondary to blood loss (chronic): Secondary | ICD-10-CM | POA: Diagnosis not present

## 2024-06-21 DIAGNOSIS — E6609 Other obesity due to excess calories: Secondary | ICD-10-CM

## 2024-06-21 NOTE — Assessment & Plan Note (Signed)
 Recurrent uterine fibroids with heavy menstrual bleeding. Scheduled for myomectomy on January 14th. Previous myomectomy provided temporary relief, but fibroids have recurred. - Proceed with scheduled myomectomy on January 14th. - Hold phentermine  two days before surgery.

## 2024-06-21 NOTE — Assessment & Plan Note (Signed)
 Obesity, class 1, managed with phentermine  and lifestyle modifications. Reports a 5-pound weight loss and regular exercise. Initial side effects of phentermine , including tachycardia, resolved after three days. No current side effects. Discussed potential for starvation mode due to low caloric intake despite regular exercise. Emphasized balanced diet with adequate caloric intake to prevent fat retention. - Continue phentermine  as prescribed. - Hold phentermine  two days before surgery and resume post-surgery. - Provided dietary handouts focusing on high protein, high fiber, low carb meals. - Encouraged regular meals and snacks to prevent starvation mode. - Consider protein shakes or milk in the morning to increase caloric intake.

## 2024-06-21 NOTE — Patient Instructions (Addendum)
 Hold the phentermine  for 2 days before you have the surgery in January. This is just a precaution to make sure the medication doesn't interact with anything they may have to give you during the procedure.   Keep working on m[employer] holdings.  It is very important to not go more than 4 hours without eating at least a small snack (not including when you are sleeping).   We will need to take a month break at the end of this three month span and then we can restart the phentermine  if it is working.

## 2024-06-21 NOTE — Progress Notes (Signed)
 Kristina Doing, DNP, AGNP-c [facility]  [address] [place], [place] [address] [phone]  ESTABLISHED PATIENT- Chronic Health and/or Follow-Up Visit on 06/21/2024  Blood pressure 128/82, pulse 92, weight 193 lb 12.8 oz (87.9 kg).   HPI: Last seen 02/24/2024 for weight management. At that time requested medication assistance for weight loss.  Was following diet and exercise regimen for 6+ months without significant change.  Started on phentermine .  Here for f/u on weight loss and to consider additional 3 months of phentermine .   History of Present Illness Kristina Leon is a 38 year old female who presents for follow-up on weight management.  She has been using phentermine  to aid in weight management, which has effectively reduced her appetite. She takes the medication around 10 or 11 AM before going to the gym. Initially, she experienced a fast heart rate for the first three days, but this side effect resolved. She has lost about five pounds since starting the medication and continues to work out regularly. No shortness of breath, palpitations, or throat swelling.  She mentions difficulty in cutting out chocolate, especially during her menstrual period, but is trying to switch to dark chocolate. Her periods have become heavier since her fibroids returned after a previous myomectomy. She is scheduled for another procedure on January 14th to address this issue. She reports that her periods were heavier last time, and she is preparing for the upcoming surgery.  She typically wakes up at 5 AM to pray, then goes back to sleep until 10 AM. She goes to the gym around 10 or 11 AM but does not eat before exercising. She usually eats two meals a day, one before work and one at lunch, and sometimes feels that she is not eating enough, which might be affecting her weight loss efforts. She uses protein shakes and is trying to incorporate more snacks to avoid going into 'starvation  mode'.  All ROS negative with exception of what is listed above.   PHYSICAL EXAM Physical Exam Vitals and nursing note reviewed.  Constitutional:      General: She is not in acute distress.    Appearance: Normal appearance.  HENT:     Head: Normocephalic.  Eyes:     Pupils: Pupils are equal, round, and reactive to light.  Cardiovascular:     Rate and Rhythm: Normal rate and regular rhythm.     Pulses: Normal pulses.     Heart sounds: Normal heart sounds.  Pulmonary:     Effort: Pulmonary effort is normal.     Breath sounds: Normal breath sounds.  Abdominal:     General: Bowel sounds are normal.     Palpations: Abdomen is soft.  Musculoskeletal:        General: Normal range of motion.     Cervical back: Normal range of motion and neck supple.     Right lower leg: No edema.     Left lower leg: No edema.  Skin:    General: Skin is warm.  Neurological:     General: No focal deficit present.     Mental Status: She is alert and oriented to person, place, and time.     Sensory: No sensory deficit.     Motor: No weakness.  Psychiatric:        Mood and Affect: Mood normal.     PLAN Problem List Items Addressed This Visit     Iron  deficiency anemia due to chronic blood loss   Chronic iron   deficiency anemia secondary to blood loss, likely related to uterine fibroids. Reports heavier menstrual periods post-myomectomy. No acute symptoms reported. - Checked iron  levels and red blood cell count.      Relevant Orders   CBC with Differential/Platelet   Iron , TIBC and Ferritin Panel   Uterine leiomyoma   Recurrent uterine fibroids with heavy menstrual bleeding. Scheduled for myomectomy on January 14th. Previous myomectomy provided temporary relief, but fibroids have recurred. - Proceed with scheduled myomectomy on January 14th. - Hold phentermine  two days before surgery.      Class 1 obesity without serious comorbidity with body mass index (BMI) of 32.0 to 32.9 in adult -  Primary   Obesity, class 1, managed with phentermine  and lifestyle modifications. Reports a 5-pound weight loss and regular exercise. Initial side effects of phentermine , including tachycardia, resolved after three days. No current side effects. Discussed potential for starvation mode due to low caloric intake despite regular exercise. Emphasized balanced diet with adequate caloric intake to prevent fat retention. - Continue phentermine  as prescribed. - Hold phentermine  two days before surgery and resume post-surgery. - Provided dietary handouts focusing on high protein, high fiber, low carb meals. - Encouraged regular meals and snacks to prevent starvation mode. - Consider protein shakes or milk in the morning to increase caloric intake.       Relevant Orders   Hemoglobin A1c   Other Visit Diagnoses       Need for influenza vaccination       Relevant Orders   Flu vaccine trivalent PF, 6mos and older(Flulaval,Afluria,Fluarix,Fluzone) (Completed)       January 14 having myomectomy for uterine fibroids.   Return in about 4 months (around 10/19/2024) for Med Management 30- Weight.  Kristina Doing, DNP, AGNP-c

## 2024-06-21 NOTE — Assessment & Plan Note (Signed)
 Chronic iron  deficiency anemia secondary to blood loss, likely related to uterine fibroids. Reports heavier menstrual periods post-myomectomy. No acute symptoms reported. - Checked iron  levels and red blood cell count.

## 2024-06-22 ENCOUNTER — Ambulatory Visit: Payer: Self-pay | Admitting: Nurse Practitioner

## 2024-06-22 LAB — CBC WITH DIFFERENTIAL/PLATELET
Basophils Absolute: 0 x10E3/uL (ref 0.0–0.2)
Basos: 1 %
EOS (ABSOLUTE): 0.1 x10E3/uL (ref 0.0–0.4)
Eos: 2 %
Hematocrit: 39.2 % (ref 34.0–46.6)
Hemoglobin: 12.6 g/dL (ref 11.1–15.9)
Immature Grans (Abs): 0 x10E3/uL (ref 0.0–0.1)
Immature Granulocytes: 0 %
Lymphocytes Absolute: 1.9 x10E3/uL (ref 0.7–3.1)
Lymphs: 32 %
MCH: 27.8 pg (ref 26.6–33.0)
MCHC: 32.1 g/dL (ref 31.5–35.7)
MCV: 87 fL (ref 79–97)
Monocytes Absolute: 0.5 x10E3/uL (ref 0.1–0.9)
Monocytes: 9 %
Neutrophils Absolute: 3.3 x10E3/uL (ref 1.4–7.0)
Neutrophils: 56 %
Platelets: 417 x10E3/uL (ref 150–450)
RBC: 4.53 x10E6/uL (ref 3.77–5.28)
RDW: 13.4 % (ref 11.7–15.4)
WBC: 5.8 x10E3/uL (ref 3.4–10.8)

## 2024-06-22 LAB — IRON,TIBC AND FERRITIN PANEL
Ferritin: 30 ng/mL (ref 15–150)
Iron Saturation: 16 % (ref 15–55)
Iron: 50 ug/dL (ref 27–159)
Total Iron Binding Capacity: 312 ug/dL (ref 250–450)
UIBC: 262 ug/dL (ref 131–425)

## 2024-06-22 LAB — HEMOGLOBIN A1C
Est. average glucose Bld gHb Est-mCnc: 103 mg/dL
Hgb A1c MFr Bld: 5.2 % (ref 4.8–5.6)

## 2024-06-26 ENCOUNTER — Other Ambulatory Visit (HOSPITAL_COMMUNITY): Payer: Self-pay

## 2024-08-07 ENCOUNTER — Encounter: Payer: Self-pay | Admitting: *Deleted

## 2024-08-08 ENCOUNTER — Encounter (HOSPITAL_COMMUNITY): Payer: Self-pay | Admitting: Obstetrics and Gynecology

## 2024-08-08 ENCOUNTER — Encounter (HOSPITAL_COMMUNITY): Payer: Self-pay

## 2024-08-08 NOTE — Progress Notes (Signed)
 Surgical Instructions  Your procedure is scheduled on :  Wednesday January 14th, 2026 Report to [facility] Main Entrance A at 6:30 AM, then check in the Admitting office. Any questions or running late day of surgery :  call [phone]  Questions prior to your surgery day:  call [phone], Monday -- Friday 8am - 4pm. If you experience any cold or flu symptoms such as cough, fever, chills, shortness of breath, etc. between now and you scheduled surgery, please notify your surgeon office.   Remember: Do Not eat any food after midnight the night before surgery.     This includes No water,  candy,  gum, and mints.  Take these medicines the morning of surgery with A SIPS OF WATER:  NONE   May take these medicines IF NEEDED:  NONE   One week prior to surgery, STOP taking any Aspirin (unless otherwise instructed by your surgeon) Aleve, Naproxen, ibuprofen , Motrin , Advil , Goody's, BC's, all herbal medications/ supplements, fish oil, and non-prescription vitamins.  Do NOT Smoke (tobacco/ vaping) and Do Not drink alcohol for 24 hours prior to your procedure.  For those patients that use a CPAP.  Please bring your CPAP/ mask/ tubing with them day of surgery . Anesthesia may ask recovery room nurse to use and if you stay the night you be asked to use it.  You will be asked to removed any contacts, glasses, piercing's, hearing aid's, dentures/ partials prior to surgery.  Please bring cases/ container/ solution/ etc., for them day of surgery.   Patients discharged the day of surgery will NOT be allowed to drive home.  You must have responsible driver and caregiver to stay at home with you the next 24 hours.  SURGICAL WAITING ROOM VISITATION Patients may have no more than 2 support people in the waiting area - if more than 2 , these visitors may rotate.  Pre-op nurse will coordinate an appropriate time for 1 Adult support person, who may not rotate, to accompany patient in pre-op.  Aware some  patients may have certain circumstances, speak to pre-op nurse day of surgery.  Children under the age 70 must have an adult with them who is not the patient and must remain in the main waiting area with an adult.  If the patient needs to stay at the hospital during part of their recovery, the visitor guidelines for inpatient rooms apply.  Please refer to the [facility] website for the visitor guidelines for any additional information.  If you received a COVID test during your pre-op visit it is requested that you wear a mask when out in public, stay away from anyone that may not be feeling well and notify your surgeon if you develop symptoms.  If you have been in contact with anyone that has tested positive in the past 10 days notify your surgeon.     [facility] - Preparing for Surgery  Before surgery, you can play an important role. Because skin is not sterile, it needs to be as free of germs as possible. You can reduce the number of germs on your skin by washing with CHG (chlorhexidine  gluconate) soap before surgery. CHG is an antiseptic cleaner which kills germs and bonds with the skin to continue killing germs even after washing. Oral hygiene is also important in reducing the risk of infection. Remember to brush your teeth with your regular toothpaste the morning of surgery.  Please DO NOT use if you have an allergy to CHG or antibacterial soaps.  If your skin becomes reddened/irritated stop using the CHG and inform your Pre-op nurse day of surgery.  DO NOT shave (including legs and genital area) for at least 48 hours prior to your CHG shower.   Please follow these instructions carefully:  Shower with CHG soap the night before surgery. If you choose to wash your hair, wash your hair first as usual with your normal shampoo. After you shampoo, rinse your hair and body thoroughly to remove the shampoo. Use CHG as you would any other liquid soap. You can apply CHG directly to the skin and  wash gently with a clean washcloth or shower sponge. Apply the CHG soap to your body ONLY FROM THE NECK DOWN. Do not use on open wounds or open sores. Avoid contact with your eyes, ears, mouth, and genitals (private parts). Wash genitals (private parts) with your normal soap. Wash thoroughly, paying special attention to the area where your surgery will be performed. Thoroughly rinse your body with warm water from the neck down. DO NOT shower/wash with your normal soap after using and rinsing off the CHG soap. DO NOT use lotions, oils, etc., after showering with CHG. Pat yourself dry with a clean towel. Wear clean pajamas. Place clean sheets on your bed the night of your CHG shower and do not sleep with pets.  Day of Surgery  DO NOT Apply any lotions,  powder,  oils,  deodorants (may use underarm deodorant),  cologne/  perfumes  or makeup Do Not wear jewelry /  piercing's/  metal/  permanent jewelry must be removed prior to arrival day of surgery. (No plastic piercing) Do Not wear nail polish,  gel polish,  artificial nails, or any other type of covering on natural finger nails (toe nails are okay) Remember to brush your teeth and rinse mouth out. Put on clean / comfortable clothes. [facility] is not responsible for valuables/ personal belongings

## 2024-08-08 NOTE — Progress Notes (Signed)
 Spoke w/ via phone for pre-op interview--- [facility] needs dos---- UPT per anesthesia, day of surgery. CBC and T&S per protocol. Lab appt 08/13/24 at 0930.        Lab results------ COVID test -----patient states asymptomatic no test needed Arrive at -------0630 NPO after MN NO Solid Food.   Pre-Surgery Ensure or G2:  Med rec completed Medications to take morning of surgery -----NONE Diabetic medication -----  GLP1 agonist last dose: GLP1 instructions:  Patient instructed no nail polish to be worn day of surgery Patient instructed to bring photo id and insurance card day of surgery Patient aware to have Driver (ride ) / caregiver    for 24 hours after surgery - Sister Mozell Buddle Patient Special Instructions ----- Hold Adipex at least 24hrs prior to surgery. Pre-Op special Instructions -----  Patient verbalized understanding of instructions that were given at this phone interview. Patient denies chest pain, sob, fever, cough at the interview.

## 2024-08-09 ENCOUNTER — Ambulatory Visit: Admitting: Obstetrics and Gynecology

## 2024-08-09 VITALS — BP 124/82 | HR 83 | Ht 65.0 in | Wt 192.6 lb

## 2024-08-09 DIAGNOSIS — D500 Iron deficiency anemia secondary to blood loss (chronic): Secondary | ICD-10-CM | POA: Diagnosis not present

## 2024-08-09 DIAGNOSIS — D259 Leiomyoma of uterus, unspecified: Secondary | ICD-10-CM | POA: Diagnosis not present

## 2024-08-09 DIAGNOSIS — Z01818 Encounter for other preprocedural examination: Secondary | ICD-10-CM

## 2024-08-09 DIAGNOSIS — N920 Excessive and frequent menstruation with regular cycle: Secondary | ICD-10-CM | POA: Diagnosis not present

## 2024-08-09 MED ORDER — OXYCODONE HCL 5 MG PO TABS: 5.0000 mg | ORAL_TABLET | ORAL | 0 refills | Status: DC | PRN

## 2024-08-09 MED ORDER — METOCLOPRAMIDE HCL 10 MG PO TABS: 10.0000 mg | ORAL_TABLET | Freq: Three times a day (TID) | ORAL | 0 refills | Status: AC | PRN

## 2024-08-09 MED ORDER — IBUPROFEN 800 MG PO TABS: 800.0000 mg | ORAL_TABLET | Freq: Three times a day (TID) | ORAL | 1 refills | Status: AC | PRN

## 2024-08-09 NOTE — H&P (View-Only) (Signed)
 "  Acute Office Visit  Subjective:    Patient ID: Kristina Leon, female    DOB: 09/12/1985, 39 y.o.   MRN: [id number]   HPI Patient presents for surgical consult for robotic myomectomy, myosure D&C, ECC, chromopertubation from Kristina Leon  39 y.o. presents today for ultrasound. H/O heavy menses s/t uterine fibroids. Has received iron  transfusions in the past. Taking oral supplement daily. Bleeding managed well with Nuvaring. Normal iron  levels 9/5. S/P Myomectomy 01/2016. US  12/2020 showed 6 fibroids, largest measuring 5.2 cm. Interested in something to help manage fibroids. Offered Myfembree  in the past but did not start. Feels more pressure in her abdomen. Hoping for pregnancy in the future.  She would like to have robotic vs. Open myomectomy.  Patient's last menstrual period was 07/05/2024 (exact date). Period Cycle (Days): 28 Period Duration (Days): 7 Period Pattern: Regular Menstrual Flow: Moderate Menstrual Control: Panty liner Menstrual Control Change Freq (Hours): 3 Dysmenorrhea: (!) Mild Dysmenorrhea Symptoms: Cramping  Review of Systems  Constitutional: Negative.   Genitourinary:  Positive for menstrual problem.       Objective:    Physical Exam Constitutional:      Appearance: Normal appearance.   GU: Not indicated  BP 124/82 (BP Location: Left Arm, Patient Position: Sitting, Cuff Size: Normal)   Pulse 83   Ht 5' 5 (1.651 m)   Wt 192 lb 9.6 oz (87.4 kg)   LMP 07/05/2024 (Exact Date)   SpO2 99%   BMI 32.05 kg/m  Wt Readings from Last 3 Encounters:  08/09/24 192 lb 9.6 oz (87.4 kg)  06/21/24 193 lb 12.8 oz (87.9 kg)  05/17/24 192 lb 3.2 oz (87.2 kg)        Assessment & Plan:   Problem List Items Addressed This Visit   None    Transabdominal ultrasound (comparison is made to 2022 ultrasound): Anteverted uterus, enlarged with multiple intramural/subserosal/submucosal/pedunculated fibroids.  Total of 15 (see report for details).  Largest measuring 4  cm.  Endometrium 9 mm with limited views due to fibroids.  Submucosal fibroid noted 14 x 11 mm in fundal endometrium.  Both ovaries within normal limits.  No adnexal masses, no free fluid.  Plan:  Symptomatic fibroids History of myomectomy Menorrhagia Anemia Submucosal fibroid Desires future fertility  Discussed the robotic myomectomy in detail.  Discussed the risk of bleeding, risk of blood products, infection, risk 50% in 5 years of fibroids recur, discussed the risk of hysterectomy in the event of bleeding.  Discussed that it may not be safe to remove all the visible fibroids and will be assessed during the case and how much blood loss is noted after each removal.  Discussed the olympus bag and morcellator and indications and process to use this.  Discussed possible laparotomy.  She may still have irregular bleeding after, but with the myomectomy should get some time with significantly improved cycles after.  Discussed running the myosure before the myomectomy to assess if there are any submucosal fibroids and identify the location of them.  Smaller ie less than 4cm can be removed with the myosure. Patient would like to proceed with the procedure. Case request was sent. Discussed that she will need a cesarean delivery in the future and still offered an open myomectomy and she would like to have the robotic myomectomy.  This procedure has been fully reviewed with the patient and written informed consent has been obtained.  40 minutes spent on reviewing records, imaging,  and one on one patient time and  counseling patient and documentation    Kristina MARLA Carpen DNP, 8:46 AM 08/09/2024 "

## 2024-08-09 NOTE — Progress Notes (Unsigned)
 "  Acute Office Visit  Subjective:    Patient ID: Kristina Leon, female    DOB: 09/12/1985, 39 y.o.   MRN: [id number]   HPI Patient presents for surgical consult for robotic myomectomy, myosure D&C, ECC, chromopertubation from Kristina Leon  39 y.o. presents today for ultrasound. H/O heavy menses s/t uterine fibroids. Has received iron  transfusions in the past. Taking oral supplement daily. Bleeding managed well with Nuvaring. Normal iron  levels 9/5. S/P Myomectomy 01/2016. US  12/2020 showed 6 fibroids, largest measuring 5.2 cm. Interested in something to help manage fibroids. Offered Myfembree  in the past but did not start. Feels more pressure in her abdomen. Hoping for pregnancy in the future.  She would like to have robotic vs. Open myomectomy.  Patient's last menstrual period was 07/05/2024 (exact date). Period Cycle (Days): 28 Period Duration (Days): 7 Period Pattern: Regular Menstrual Flow: Moderate Menstrual Control: Panty liner Menstrual Control Change Freq (Hours): 3 Dysmenorrhea: (!) Mild Dysmenorrhea Symptoms: Cramping  Review of Systems  Constitutional: Negative.   Genitourinary:  Positive for menstrual problem.       Objective:    Physical Exam Constitutional:      Appearance: Normal appearance.   GU: Not indicated  BP 124/82 (BP Location: Left Arm, Patient Position: Sitting, Cuff Size: Normal)   Pulse 83   Ht 5' 5 (1.651 m)   Wt 192 lb 9.6 oz (87.4 kg)   LMP 07/05/2024 (Exact Date)   SpO2 99%   BMI 32.05 kg/m  Wt Readings from Last 3 Encounters:  08/09/24 192 lb 9.6 oz (87.4 kg)  06/21/24 193 lb 12.8 oz (87.9 kg)  05/17/24 192 lb 3.2 oz (87.2 kg)        Assessment & Plan:   Problem List Items Addressed This Visit   None    Transabdominal ultrasound (comparison is made to 2022 ultrasound): Anteverted uterus, enlarged with multiple intramural/subserosal/submucosal/pedunculated fibroids.  Total of 15 (see report for details).  Largest measuring 4  cm.  Endometrium 9 mm with limited views due to fibroids.  Submucosal fibroid noted 14 x 11 mm in fundal endometrium.  Both ovaries within normal limits.  No adnexal masses, no free fluid.  Plan:  Symptomatic fibroids History of myomectomy Menorrhagia Anemia Submucosal fibroid Desires future fertility  Discussed the robotic myomectomy in detail.  Discussed the risk of bleeding, risk of blood products, infection, risk 50% in 5 years of fibroids recur, discussed the risk of hysterectomy in the event of bleeding.  Discussed that it may not be safe to remove all the visible fibroids and will be assessed during the case and how much blood loss is noted after each removal.  Discussed the olympus bag and morcellator and indications and process to use this.  Discussed possible laparotomy.  She may still have irregular bleeding after, but with the myomectomy should get some time with significantly improved cycles after.  Discussed running the myosure before the myomectomy to assess if there are any submucosal fibroids and identify the location of them.  Smaller ie less than 4cm can be removed with the myosure. Patient would like to proceed with the procedure. Case request was sent. Discussed that she will need a cesarean delivery in the future and still offered an open myomectomy and she would like to have the robotic myomectomy.  This procedure has been fully reviewed with the patient and written informed consent has been obtained.  40 minutes spent on reviewing records, imaging,  and one on one patient time and  counseling patient and documentation    Kristina MARLA Carpen DNP, 8:46 AM 08/09/2024 "

## 2024-08-13 ENCOUNTER — Ambulatory Visit: Payer: Self-pay | Admitting: Obstetrics and Gynecology

## 2024-08-13 ENCOUNTER — Encounter (HOSPITAL_COMMUNITY)
Admission: RE | Admit: 2024-08-13 | Discharge: 2024-08-13 | Disposition: A | Discharge status: Home / Self Care | Source: Ambulatory Visit | Attending: Obstetrics and Gynecology | Admitting: Obstetrics and Gynecology

## 2024-08-13 DIAGNOSIS — N920 Excessive and frequent menstruation with regular cycle: Secondary | ICD-10-CM | POA: Diagnosis present

## 2024-08-13 DIAGNOSIS — Z01812 Encounter for preprocedural laboratory examination: Secondary | ICD-10-CM | POA: Insufficient documentation

## 2024-08-13 DIAGNOSIS — N946 Dysmenorrhea, unspecified: Secondary | ICD-10-CM | POA: Diagnosis not present

## 2024-08-13 DIAGNOSIS — D251 Intramural leiomyoma of uterus: Secondary | ICD-10-CM | POA: Diagnosis not present

## 2024-08-13 DIAGNOSIS — Z79899 Other long term (current) drug therapy: Secondary | ICD-10-CM | POA: Diagnosis not present

## 2024-08-13 DIAGNOSIS — Z01818 Encounter for other preprocedural examination: Secondary | ICD-10-CM

## 2024-08-13 DIAGNOSIS — D500 Iron deficiency anemia secondary to blood loss (chronic): Secondary | ICD-10-CM | POA: Diagnosis not present

## 2024-08-13 DIAGNOSIS — Z0289 Encounter for other administrative examinations: Secondary | ICD-10-CM

## 2024-08-13 DIAGNOSIS — D250 Submucous leiomyoma of uterus: Secondary | ICD-10-CM | POA: Diagnosis not present

## 2024-08-13 LAB — CBC
HCT: 39.3 % (ref 36.0–46.0)
Hemoglobin: 12.9 g/dL (ref 12.0–15.0)
MCH: 27.8 pg (ref 26.0–34.0)
MCHC: 32.8 g/dL (ref 30.0–36.0)
MCV: 84.7 fL (ref 80.0–100.0)
Platelets: 396 K/uL (ref 150–400)
RBC: 4.64 MIL/uL (ref 3.87–5.11)
RDW: 13.7 % (ref 11.5–15.5)
WBC: 6.6 K/uL (ref 4.0–10.5)
nRBC: 0 % (ref 0.0–0.2)

## 2024-08-13 LAB — TYPE AND SCREEN
ABO/RH(D): A POS
Antibody Screen: NEGATIVE

## 2024-08-14 MED ORDER — SODIUM CHLORIDE 0.9 % IV SOLN
INTRAVENOUS | Status: DC
  Filled 2024-08-14: qty 10

## 2024-08-14 NOTE — Anesthesia Preprocedure Evaluation (Signed)
"                                    Anesthesia Evaluation  Patient identified by MRN, date of birth, ID band Patient awake    Reviewed: Allergy & Precautions, H&P , NPO status , Patient's Chart, lab work & pertinent test results  Airway Mallampati: II  TM Distance: >3 FB Neck ROM: full    Dental no notable dental hx. (+) Teeth Intact   Pulmonary neg pulmonary ROS   Pulmonary exam normal        Cardiovascular negative cardio ROS Normal cardiovascular exam     Neuro/Psych  negative psych ROS   GI/Hepatic negative GI ROS, Neg liver ROS,,,  Endo/Other  negative endocrine ROS    Renal/GU      Musculoskeletal   Abdominal  (+) + obese  Peds  Hematology  (+) Blood dyscrasia, anemia   Anesthesia Other Findings   Reproductive/Obstetrics negative OB ROS                              Anesthesia Physical Anesthesia Plan  ASA: 2  Anesthesia Plan: General   Post-op Pain Management: Tylenol  PO (pre-op)*, Celebrex  PO (pre-op)* and Ketamine  IV*   Induction: Intravenous  PONV Risk Score and Plan: 3 and Ondansetron , Dexamethasone , Treatment may vary due to age or medical condition and Midazolam   Airway Management Planned: Oral ETT  Additional Equipment: None  Intra-op Plan:   Post-operative Plan: Extubation in OR  Informed Consent: I have reviewed the patients History and Physical, chart, labs and discussed the procedure including the risks, benefits and alternatives for the proposed anesthesia with the patient or authorized representative who has indicated his/her understanding and acceptance.     Dental Advisory Given  Plan Discussed with: CRNA, Surgeon and Anesthesiologist  Anesthesia Plan Comments:          Anesthesia Quick Evaluation  "

## 2024-08-15 ENCOUNTER — Encounter (HOSPITAL_COMMUNITY): Payer: Self-pay | Admitting: Obstetrics and Gynecology

## 2024-08-15 ENCOUNTER — Encounter (HOSPITAL_COMMUNITY)
Admission: RE | Disposition: A | Discharge status: Home / Self Care | Payer: Self-pay | Source: Home / Self Care | Attending: Obstetrics and Gynecology

## 2024-08-15 ENCOUNTER — Ambulatory Visit (HOSPITAL_COMMUNITY): Payer: Self-pay | Admitting: Anesthesiology

## 2024-08-15 ENCOUNTER — Ambulatory Visit (HOSPITAL_COMMUNITY)
Admission: RE | Admit: 2024-08-15 | Discharge: 2024-08-15 | Disposition: A | Discharge status: Home / Self Care | Attending: Obstetrics and Gynecology | Admitting: Obstetrics and Gynecology

## 2024-08-15 ENCOUNTER — Encounter (HOSPITAL_COMMUNITY): Payer: Self-pay | Admitting: Anesthesiology

## 2024-08-15 DIAGNOSIS — D251 Intramural leiomyoma of uterus: Secondary | ICD-10-CM | POA: Insufficient documentation

## 2024-08-15 DIAGNOSIS — D250 Submucous leiomyoma of uterus: Secondary | ICD-10-CM | POA: Insufficient documentation

## 2024-08-15 DIAGNOSIS — D500 Iron deficiency anemia secondary to blood loss (chronic): Secondary | ICD-10-CM | POA: Insufficient documentation

## 2024-08-15 DIAGNOSIS — N946 Dysmenorrhea, unspecified: Secondary | ICD-10-CM | POA: Insufficient documentation

## 2024-08-15 DIAGNOSIS — N920 Excessive and frequent menstruation with regular cycle: Secondary | ICD-10-CM | POA: Insufficient documentation

## 2024-08-15 DIAGNOSIS — D259 Leiomyoma of uterus, unspecified: Secondary | ICD-10-CM

## 2024-08-15 DIAGNOSIS — Z79899 Other long term (current) drug therapy: Secondary | ICD-10-CM | POA: Insufficient documentation

## 2024-08-15 DIAGNOSIS — Z01818 Encounter for other preprocedural examination: Secondary | ICD-10-CM

## 2024-08-15 HISTORY — PX: MYOSURE RESECTION: SHX7611

## 2024-08-15 HISTORY — PX: ROBOT ASSISTED MYOMECTOMY: SHX5142

## 2024-08-15 HISTORY — DX: Personal history of urinary calculi: Z87.442

## 2024-08-15 HISTORY — PX: DILATATION & CURRETTAGE/HYSTEROSCOPY WITH RESECTOCOPE: SHX5572

## 2024-08-15 HISTORY — PX: CHROMOPERTUBATION: SHX6288

## 2024-08-15 LAB — POCT PREGNANCY, URINE: Preg Test, Ur: NEGATIVE

## 2024-08-15 MED ORDER — SODIUM CHLORIDE 0.9 % IV SOLN
INTRAVENOUS | Status: AC
  Filled 2024-08-15: qty 2

## 2024-08-15 MED ORDER — CHLORHEXIDINE GLUCONATE 0.12 % MT SOLN
OROMUCOSAL | Status: AC
  Filled 2024-08-15: qty 15

## 2024-08-15 MED ORDER — ROCURONIUM BROMIDE 10 MG/ML (PF) SYRINGE
PREFILLED_SYRINGE | INTRAVENOUS | Status: DC | PRN
  Administered 2024-08-15: 70 mg via INTRAVENOUS
  Administered 2024-08-15 (×2): 10 mg via INTRAVENOUS
  Administered 2024-08-15: 30 mg via INTRAVENOUS

## 2024-08-15 MED ORDER — ROCURONIUM BROMIDE 10 MG/ML (PF) SYRINGE
PREFILLED_SYRINGE | INTRAVENOUS | Status: AC
  Filled 2024-08-15: qty 10

## 2024-08-15 MED ORDER — 0.9 % SODIUM CHLORIDE (POUR BTL) OPTIME
TOPICAL | Status: DC | PRN
  Administered 2024-08-15: 1000 mL

## 2024-08-15 MED ORDER — HYDROMORPHONE HCL 1 MG/ML IJ SOLN
INTRAMUSCULAR | Status: DC | PRN
  Administered 2024-08-15 (×2): .5 mg via INTRAVENOUS

## 2024-08-15 MED ORDER — KETOROLAC TROMETHAMINE 30 MG/ML IJ SOLN
INTRAMUSCULAR | Status: AC
  Filled 2024-08-15: qty 1

## 2024-08-15 MED ORDER — FENTANYL CITRATE (PF) 250 MCG/5ML IJ SOLN
INTRAMUSCULAR | Status: AC
  Filled 2024-08-15: qty 5

## 2024-08-15 MED ORDER — CHLORHEXIDINE GLUCONATE 0.12 % MT SOLN
15.0000 mL | Freq: Once | OROMUCOSAL | Status: AC
  Administered 2024-08-15: 15 mL via OROMUCOSAL

## 2024-08-15 MED ORDER — PROPOFOL 10 MG/ML IV BOLUS
INTRAVENOUS | Status: DC | PRN
  Administered 2024-08-15: 180 mg via INTRAVENOUS

## 2024-08-15 MED ORDER — DEXAMETHASONE SOD PHOSPHATE PF 10 MG/ML IJ SOLN
INTRAMUSCULAR | Status: AC
  Filled 2024-08-15: qty 1

## 2024-08-15 MED ORDER — ONDANSETRON HCL 4 MG/2ML IJ SOLN
INTRAMUSCULAR | Status: DC | PRN
  Administered 2024-08-15: 4 mg via INTRAVENOUS

## 2024-08-15 MED ORDER — GABAPENTIN 300 MG PO CAPS
300.0000 mg | ORAL_CAPSULE | ORAL | Status: AC
  Administered 2024-08-15: 300 mg via ORAL

## 2024-08-15 MED ORDER — CELECOXIB 200 MG PO CAPS
ORAL_CAPSULE | ORAL | Status: AC
  Filled 2024-08-15: qty 1

## 2024-08-15 MED ORDER — HEMOSTATIC AGENTS (NO CHARGE) OPTIME
TOPICAL | Status: DC | PRN
  Administered 2024-08-15: 1 via TOPICAL

## 2024-08-15 MED ORDER — ORAL CARE MOUTH RINSE: 15.0000 mL | Freq: Once | OROMUCOSAL | Status: AC

## 2024-08-15 MED ORDER — KETAMINE HCL 10 MG/ML IJ SOLN
INTRAMUSCULAR | Status: DC | PRN
  Administered 2024-08-15: 50 mg via INTRAVENOUS

## 2024-08-15 MED ORDER — OXYCODONE HCL 5 MG/5ML PO SOLN: 5.0000 mg | Freq: Once | ORAL | Status: AC | PRN

## 2024-08-15 MED ORDER — EPHEDRINE 5 MG/ML INJ
INTRAVENOUS | Status: AC
  Filled 2024-08-15: qty 5

## 2024-08-15 MED ORDER — FENTANYL CITRATE (PF) 100 MCG/2ML IJ SOLN
INTRAMUSCULAR | Status: AC
  Filled 2024-08-15: qty 2

## 2024-08-15 MED ORDER — ACETAMINOPHEN 500 MG PO TABS
ORAL_TABLET | ORAL | Status: AC
  Filled 2024-08-15: qty 2

## 2024-08-15 MED ORDER — BUPIVACAINE HCL (PF) 0.25 % IJ SOLN
INTRAMUSCULAR | Status: DC | PRN
  Administered 2024-08-15: 26 mL

## 2024-08-15 MED ORDER — METHYLENE BLUE 20 MG/2ML IV SOSY
PREFILLED_SYRINGE | INTRAVENOUS | Status: AC
  Filled 2024-08-15: qty 2

## 2024-08-15 MED ORDER — ONDANSETRON HCL 4 MG/2ML IJ SOLN
INTRAMUSCULAR | Status: AC
  Filled 2024-08-15: qty 2

## 2024-08-15 MED ORDER — OXYCODONE HCL 5 MG PO TABS
5.0000 mg | ORAL_TABLET | Freq: Once | ORAL | Status: AC | PRN
  Administered 2024-08-15: 5 mg via ORAL

## 2024-08-15 MED ORDER — BUPIVACAINE HCL (PF) 0.5 % IJ SOLN: INTRAMUSCULAR | Status: DC | PRN

## 2024-08-15 MED ORDER — METHYLENE BLUE 20 MG/2ML IV SOSY
PREFILLED_SYRINGE | INTRAVENOUS | Status: DC | PRN
  Administered 2024-08-15: 2 mL

## 2024-08-15 MED ORDER — DEXAMETHASONE SOD PHOSPHATE PF 10 MG/ML IJ SOLN
INTRAMUSCULAR | Status: DC | PRN
  Administered 2024-08-15: 10 mg via INTRAVENOUS

## 2024-08-15 MED ORDER — POVIDONE-IODINE 10 % EX SWAB: 2.0000 | Freq: Once | CUTANEOUS | Status: DC

## 2024-08-15 MED ORDER — VASOPRESSIN 20 UNIT/ML IV SOLN
INTRAVENOUS | Status: AC
  Filled 2024-08-15: qty 1

## 2024-08-15 MED ORDER — DEXMEDETOMIDINE HCL IN NACL 80 MCG/20ML IV SOLN
INTRAVENOUS | Status: DC | PRN
  Administered 2024-08-15: 10 ug via INTRAVENOUS

## 2024-08-15 MED ORDER — FENTANYL CITRATE (PF) 100 MCG/2ML IJ SOLN
25.0000 ug | INTRAMUSCULAR | Status: DC | PRN
  Administered 2024-08-15: 50 ug via INTRAVENOUS
  Administered 2024-08-15 (×2): 25 ug via INTRAVENOUS

## 2024-08-15 MED ORDER — LACTATED RINGERS IV SOLN: INTRAVENOUS | Status: DC

## 2024-08-15 MED ORDER — FLUORESCEIN SODIUM 10 % IV SOLN
INTRAVENOUS | Status: AC
  Filled 2024-08-15: qty 5

## 2024-08-15 MED ORDER — KETAMINE HCL 50 MG/5ML IJ SOSY
PREFILLED_SYRINGE | INTRAMUSCULAR | Status: AC
  Filled 2024-08-15: qty 5

## 2024-08-15 MED ORDER — METRONIDAZOLE 500 MG/100ML IV SOLN
INTRAVENOUS | Status: DC | PRN
  Administered 2024-08-15: 500 mg via INTRAVENOUS

## 2024-08-15 MED ORDER — PROPOFOL 10 MG/ML IV BOLUS
INTRAVENOUS | Status: AC
  Filled 2024-08-15: qty 20

## 2024-08-15 MED ORDER — ACETAMINOPHEN 500 MG PO TABS
1000.0000 mg | ORAL_TABLET | ORAL | Status: AC
  Administered 2024-08-15: 1000 mg via ORAL

## 2024-08-15 MED ORDER — SODIUM CHLORIDE 0.9 % IV SOLN
2.0000 g | INTRAVENOUS | Status: DC
  Filled 2024-08-15: qty 2

## 2024-08-15 MED ORDER — MISOPROSTOL 200 MCG PO TABS
600.0000 ug | ORAL_TABLET | Freq: Once | ORAL | Status: AC
  Administered 2024-08-15: 600 ug via ORAL
  Filled 2024-08-15: qty 3

## 2024-08-15 MED ORDER — ONDANSETRON HCL 4 MG/2ML IJ SOLN
4.0000 mg | Freq: Once | INTRAMUSCULAR | Status: AC | PRN
  Administered 2024-08-15: 4 mg via INTRAVENOUS

## 2024-08-15 MED ORDER — TRIAMCINOLONE ACETONIDE 40 MG/ML IJ SUSP
INTRAMUSCULAR | Status: DC | PRN
  Administered 2024-08-15: 40 mg

## 2024-08-15 MED ORDER — OXYCODONE HCL 5 MG PO TABS
ORAL_TABLET | ORAL | Status: AC
  Filled 2024-08-15: qty 1

## 2024-08-15 MED ORDER — TRANEXAMIC ACID-NACL 1000-0.7 MG/100ML-% IV SOLN
INTRAVENOUS | Status: AC
  Filled 2024-08-15: qty 100

## 2024-08-15 MED ORDER — LIDOCAINE 2% (20 MG/ML) 5 ML SYRINGE
INTRAMUSCULAR | Status: DC | PRN
  Administered 2024-08-15: 100 mg via INTRAVENOUS

## 2024-08-15 MED ORDER — HYDROMORPHONE HCL 1 MG/ML IJ SOLN
INTRAMUSCULAR | Status: AC
  Filled 2024-08-15: qty 0.5

## 2024-08-15 MED ORDER — MIDAZOLAM HCL 2 MG/2ML IJ SOLN
INTRAMUSCULAR | Status: AC
  Filled 2024-08-15: qty 2

## 2024-08-15 MED ORDER — CELECOXIB 200 MG PO CAPS
200.0000 mg | ORAL_CAPSULE | Freq: Once | ORAL | Status: AC
  Administered 2024-08-15: 200 mg via ORAL

## 2024-08-15 MED ORDER — MIDAZOLAM HCL (PF) 2 MG/2ML IJ SOLN
INTRAMUSCULAR | Status: DC | PRN
  Administered 2024-08-15: 2 mg via INTRAVENOUS

## 2024-08-15 MED ORDER — BUPIVACAINE HCL (PF) 0.5 % IJ SOLN
INTRAMUSCULAR | Status: AC
  Filled 2024-08-15: qty 30

## 2024-08-15 MED ORDER — SODIUM CHLORIDE (PF) 0.9 % IJ SOLN
INTRAMUSCULAR | Status: AC
  Filled 2024-08-15: qty 100

## 2024-08-15 MED ORDER — SODIUM CHLORIDE 0.9 % IR SOLN
Status: DC | PRN
  Administered 2024-08-15 (×2): 1000 mL
  Administered 2024-08-15: 3000 mL

## 2024-08-15 MED ORDER — GABAPENTIN 300 MG PO CAPS
ORAL_CAPSULE | ORAL | Status: AC
  Filled 2024-08-15: qty 1

## 2024-08-15 MED ORDER — LIDOCAINE 2% (20 MG/ML) 5 ML SYRINGE
INTRAMUSCULAR | Status: AC
  Filled 2024-08-15: qty 5

## 2024-08-15 MED ORDER — VASOPRESSIN 20 UNIT/ML IV SOLN
INTRAVENOUS | Status: DC | PRN
  Administered 2024-08-15: 47 mL via INTRAMUSCULAR

## 2024-08-15 MED ORDER — MEPERIDINE HCL 25 MG/ML IJ SOLN: 6.2500 mg | INTRAMUSCULAR | Status: DC | PRN

## 2024-08-15 MED ORDER — TRANEXAMIC ACID-NACL 1000-0.7 MG/100ML-% IV SOLN
1000.0000 mg | Freq: Once | INTRAVENOUS | Status: AC
  Administered 2024-08-15: 1000 mg via INTRAVENOUS

## 2024-08-15 MED ORDER — FENTANYL CITRATE (PF) 250 MCG/5ML IJ SOLN
INTRAMUSCULAR | Status: DC | PRN
  Administered 2024-08-15 (×5): 50 ug via INTRAVENOUS

## 2024-08-15 MED ORDER — SODIUM CHLORIDE 0.9 % IV SOLN
INTRAVENOUS | Status: DC | PRN
  Administered 2024-08-15: 2 g via INTRAVENOUS

## 2024-08-15 MED ORDER — SUGAMMADEX SODIUM 200 MG/2ML IV SOLN
INTRAVENOUS | Status: DC | PRN
  Administered 2024-08-15: 200 mg via INTRAVENOUS

## 2024-08-15 MED ORDER — BUPIVACAINE HCL (PF) 0.25 % IJ SOLN
INTRAMUSCULAR | Status: AC
  Filled 2024-08-15: qty 30

## 2024-08-15 MED ORDER — SOD CITRATE-CITRIC ACID 500-334 MG/5ML PO SOLN: 30.0000 mL | ORAL | Status: DC

## 2024-08-15 MED ORDER — PHENYLEPHRINE 80 MCG/ML (10ML) SYRINGE FOR IV PUSH (FOR BLOOD PRESSURE SUPPORT)
PREFILLED_SYRINGE | INTRAVENOUS | Status: AC
  Filled 2024-08-15: qty 10

## 2024-08-15 MED ORDER — MONSELS FERRIC SUBSULFATE EX SOLN
CUTANEOUS | Status: AC
  Filled 2024-08-15: qty 8

## 2024-08-15 NOTE — Op Note (Signed)
 " PROCEDURE DATE: 12/16/2023   08/15/2024 Kristina Leon [id number]    OPERATIVE REPORT  Preop Diagnosis: menorrhagia, dysmenorrhea, history of myomectomy, anemia, desires conception, recurrent fibroids  Post operative diagnosis: same  Procedure: Myosure hysteroscopy Dilation and curettage, endocervical curettage, removal of submucosal fibroid,  robotic myomectomy, extensive lysis of adhesions, olympus bag with morcellation, chromopertubation  Surgeon: Dr. Almarie Rollo Carpen Assistant:   Circulator: [place], Hadassah Buel RAMAN, RN Relief Circulator: Clarisse Gander A Relief Scrub: Clarisse Gander A Scrub Person: Janell, Marina  G; Dabbs, Rosina HERO, CST RN First Assistant: Jackquline Judyann CROME, RN   Fluids: please see anesthesia report Fluid deficit: 1100cc  Complications: None Anesthesia: general   Findings: Small appearing cervix and uterine cavity measuring 14cm with both ostia seen. Left osteum appeared occluded from the hysteroscope view of the opening. 19m submucosal fibroid seen and removed.  On the robotic portion it was noted that the uterus was boggy like adenomyosis appearance with about 4 small pedunculated fibroids and 4 intramural fibroids. Dense adhesions from the large bowel to the posterior uterus. Both fallopian tubes with adhesions and kinked. The left tube had about a 3-4cm penduculated fibroid that was sitting on it. Right tube was twisted and attached to the uterus.   No evidence of endometriosis was seen. Both tubes were released and had somewhat normal appearing fimbring. Several repeat attempts to flush dye through the tubes were unsuccessful before and after the lysis of adhesions to release them. Normal uterus with no obvious fibroids seen, 5cm right paratubal cyst seen. Normal tubes, normal healthy fimbria. Both tubes  No evidence of endometriosis, normal appendix. Normal ovaries. Left ovary smaller with concern for less antral follicle count on this  side.   Estimated blood loss: 250cc  Specimens: Endometrial curettings, ECC, fibroids  Disposition of specimen: Pathology    Procedure: Patient was taken to the OR where she was placed in dorsal lithotomy in Allen stirrups. SCDs were in place.  The patient was prepped and draped in the usual sterile fashion. An adequate timeout was obtained and everyone agreed. A foley catheter was placed to drain the bladder throughout the procedure. A bivalve speculum was placed inside the vagina and the cervix visualized. The cervix was grasped anteriorly with a single-tooth tenaculum.  The uterus sounded to 14 cm. Sequential dilation was done to a #8 dilator, and the myosure hysteroscope was introduced into the uterine cavity. The cervix and endometrial lining appeared normal both ostia were seen there was no deformity of the cavity such as a septate or anomaly.  A small 2cm submucosal fibroid was seen and removed easily with the myosure reach. The myosure reach was also used to sample the entire cavity.  The hysteroscope was removed. The ECC was performed, and was sent to pathology.  The rumi was inserted to move the uterus throughout the procedure and perform the chromopertubation.  Attention was turned to the abdomen where an 8mm umbilical incision was made with the scalpel.  The 8mm trocar and sleeve were then advanced without difficulty with the laparoscope under direct visualization into the abdomen.  The abdomen was then insufflated with carbon dioxide gas and adequate pneumoperitoneum was obtained.  Patient was placed in steep trendelenburg position. A detailed survey of the patient's pelvis and abdomen revealed the findings as mentioned above.   Three additional 8mm robotic ports were placed under direct visualization in the left and right lower quadrant and one in the upper left quadrant for the assistant port.. No endometriosis  was noted. A diluted concentration of vasopressin  was injected at the fibroids  until blanching was noted.  Approximately 10cc of 1:100 concentration was used.  The chromopertubation was completed before and after the myomectomy.  A great deal of attention was spent to release the fallopian tubes on both sides and restore their normal anatomy.  However, the tubes did not pass dye from either side with multiple attempts and even after freeing the adhesions from both tubes and releasing them.  The adhesions from the posterior uterus were freed with sharp dissection with close attention to the bowel throughout the process.  No compromise to the bowel was noted.  The fibroids were removed and tied together with zero v-lock to insure all were removed at the end. Eight fibroids were tied together.  The uterus was repaired with zero v-lock on the posterior side with 3 running sutures. Pedunclated fibroids removed had good hemostasis with cautery at the base.  Good hemostasis was seen.   Arista was used for postop hemostasis. No intraoperative injury to surrounding organs was noted once again.  The fibroids were placed in the olympus bag and air was used to insuflate the bag. The camera and morecellator were used in the bag to remove the fibroids in pieces, under direct visualization.  No compromise to the bag was noted at any time during this.  The olympus bag was removed.  The abdomen was desufflated and all instruments were then removed from the patient's abdomen. The uterine manipulator was removed without complications along with the foley.  The umbilical fascia was repaired with zero vicryl on a UR-6 needle.  The robotic trocars were removed under direct visualization.  All incisions were closed with Dermabond. The patient tolerated the procedures well.  All instruments, needles, and sponge counts were correct x 2. The patient was taken to the recovery room in stable condition.    Dr. Glennon  "

## 2024-08-15 NOTE — Transfer of Care (Signed)
 Immediate Anesthesia Transfer of Care Note  Patient: Kristina Leon  Procedure(s) Performed: MYOMECTOMY, ROBOT-ASSISTED, OLYMPUS BAG WITH MORCELLATION DILATATION & CURETTAGE/HYSTEROSCOPY MYOSURE RESECTION, REMOVAL OF SUBMUCOSAL FIBROID, ENDOMETRIAL AND ENDOCERVICAL CURETTAGE CHROMOPERTUBATION, FALLOPIAN TUBE (Bilateral)  Patient Location: PACU  Anesthesia Type:General  Level of Consciousness: drowsy  Airway & Oxygen Therapy: Patient Spontanous Breathing  Post-op Assessment: Report given to RN and Post -op Vital signs reviewed and stable  Post vital signs: Reviewed and stable  Last Vitals:  Vitals Value Taken Time  BP 130/85 08/15/24 13:02  Temp    Pulse 102 08/15/24 13:04  Resp 27 08/15/24 13:04  SpO2 98 % 08/15/24 13:04  Vitals shown include unfiled device data.  Last Pain:  Vitals:   08/15/24 0707  TempSrc: Oral  PainSc: 0-No pain      Patients Stated Pain Goal: 5 (08/15/24 0707)  Complications: No notable events documented.

## 2024-08-15 NOTE — Anesthesia Postprocedure Evaluation (Signed)
"   Anesthesia Post Note  Patient: Kristina Leon  Procedure(s) Performed: MYOMECTOMY, ROBOT-ASSISTED, OLYMPUS BAG WITH MORCELLATION (Uterus) DILATATION & CURETTAGE/HYSTEROSCOPY (Vagina ) MYOSURE RESECTION, REMOVAL OF SUBMUCOSAL FIBROID, ENDOMETRIAL AND ENDOCERVICAL CURETTAGE (Uterus) CHROMOPERTUBATION, FALLOPIAN TUBE (Bilateral)     Patient location during evaluation: PACU Anesthesia Type: General Level of consciousness: awake and alert Pain management: pain level controlled Vital Signs Assessment: post-procedure vital signs reviewed and stable Respiratory status: spontaneous breathing, nonlabored ventilation, respiratory function stable and patient connected to nasal cannula oxygen Cardiovascular status: blood pressure returned to baseline and stable Postop Assessment: no apparent nausea or vomiting Anesthetic complications: no   No notable events documented.  Last Vitals:  Vitals:   08/15/24 0707 08/15/24 1305  BP: 102/70 130/85  Pulse: 85 (!) 109  Resp: 16 20  Temp: 37.1 C 36.8 C  SpO2: 100% 99%    Last Pain:  Vitals:   08/15/24 0707  TempSrc: Oral  PainSc: 0-No pain                 [name]      "

## 2024-08-15 NOTE — Discharge Instructions (Signed)

## 2024-08-15 NOTE — Interval H&P Note (Signed)
 History and Physical Interval Note:  08/15/2024 8:14 AM  Kristina Leon  has presented today for surgery, with the diagnosis of menorrhagia with regular cycle, iron  deficiency anemia due to chroinc blood loss, uterine leiomyoma..  The various methods of treatment have been discussed with the patient and family. After consideration of risks, benefits and other options for treatment, the patient has consented to  Procedures with comments: MYOMECTOMY, ROBOT-ASSISTED (N/A) - olympus bag and lina morcellation DILATATION & CURETTAGE/HYSTEROSCOPY WITH RESECTOCOPE (N/A) - Myosure and ECC MYOSURE RESECTION, REMOVAL OF SUBMUCOSAL FIBROID, ENDOMETRIAL AND ENDOCERVICAL CURETTAGE as a surgical intervention.  The patient's history has been reviewed, patient examined, no change in status, stable for surgery.  I have reviewed the patient's chart and labs.  Questions were answered to the patient's satisfaction.     [name]

## 2024-08-15 NOTE — Anesthesia Procedure Notes (Addendum)
 Procedure Name: Intubation Date/Time: 08/15/2024 8:41 AM  Performed by: Elby Raelene SAUNDERS, CRNAPre-anesthesia Checklist: Patient identified, Emergency Drugs available, Suction available and Patient being monitored Patient Re-evaluated:Patient Re-evaluated prior to induction Oxygen Delivery Method: Circle System Utilized Preoxygenation: Pre-oxygenation with 100% oxygen Induction Type: IV induction Ventilation: Mask ventilation without difficulty Laryngoscope Size: Miller and 2 Grade View: Grade I Tube type: Oral Tube size: 7.0 mm Number of attempts: 1 Airway Equipment and Method: Stylet and Bite block Placement Confirmation: ETT inserted through vocal cords under direct vision, positive ETCO2 and breath sounds checked- equal and bilateral Secured at: 21 cm Tube secured with: Tape Dental Injury: Teeth and Oropharynx as per pre-operative assessment

## 2024-08-16 ENCOUNTER — Encounter (HOSPITAL_COMMUNITY): Payer: Self-pay | Admitting: Obstetrics and Gynecology

## 2024-08-17 LAB — SURGICAL PATHOLOGY

## 2024-08-19 ENCOUNTER — Ambulatory Visit: Payer: Self-pay | Admitting: Obstetrics and Gynecology

## 2024-08-29 ENCOUNTER — Encounter: Payer: Self-pay | Admitting: Obstetrics and Gynecology

## 2024-08-29 ENCOUNTER — Ambulatory Visit (INDEPENDENT_AMBULATORY_CARE_PROVIDER_SITE_OTHER): Admitting: Obstetrics and Gynecology

## 2024-08-29 VITALS — BP 118/60 | HR 106 | Wt 190.4 lb

## 2024-08-29 DIAGNOSIS — Z09 Encounter for follow-up examination after completed treatment for conditions other than malignant neoplasm: Secondary | ICD-10-CM

## 2024-08-29 MED ORDER — DOCUSATE SODIUM 100 MG PO CAPS: 100.0000 mg | ORAL_CAPSULE | Freq: Two times a day (BID) | ORAL | 3 refills | Status: AC

## 2024-08-29 MED ORDER — OXYCODONE HCL 5 MG PO TABS: 5.0000 mg | ORAL_TABLET | ORAL | 0 refills | Status: AC | PRN

## 2024-08-29 NOTE — Progress Notes (Signed)
" ° °  Acute Office Visit  Subjective:    Patient ID: Kristina Leon, female    DOB: 08/06/1985, 39 y.o.   MRN: [id number]   HPI 39 y.o. presents today for Post-op Follow-up (2wk post op/Left side pain and having constipation.  /Having some bleeding today) . Myosure D&C, ECC, robotic myomectomy with olympus bag and morcellation, lysis of adhesions, chromopertubation  Having some LLQ pain and constipation. Stopped the iron . No BM for 3 days Also states she feels like she is starting her cycle. She would like to have a refill on her pain medication  Patient's last menstrual period was 08/29/2024 (exact date).    Review of Systems     Objective:    OBGyn Exam  BP 118/60   Pulse (!) 106   Wt 190 lb 6.4 oz (86.4 kg)   LMP 08/29/2024 (Exact Date)   SpO2 99%   BMI 31.68 kg/m  Wt Readings from Last 3 Encounters:  08/29/24 190 lb 6.4 oz (86.4 kg)  08/15/24 200 lb (90.7 kg)  08/09/24 192 lb 9.6 oz (87.4 kg)       Incisions I/c/d no erythema and healed  Assessment & Plan:  2 week postop To begin daily colace and do either fleet enema or magnesium citrate today Pathology and operative report discussed with patient. No tubal patency could be shown in surgery with several attempts at the chromopertubation. Encouraged patient to begin IVF process at [facility] with Dr. Yalcinkaya.  She agreed. RTC for annual exams and with any concerns  Kristina Leon  "

## 2024-09-21 ENCOUNTER — Encounter: Admitting: Obstetrics and Gynecology

## 2024-10-22 ENCOUNTER — Ambulatory Visit: Admitting: Nurse Practitioner

## 2025-02-04 ENCOUNTER — Encounter: Payer: Self-pay | Admitting: Nurse Practitioner

## 2025-04-04 ENCOUNTER — Ambulatory Visit: Admitting: Nurse Practitioner
